# Patient Record
Sex: Male | Born: 1995 | Race: Black or African American | Hispanic: No | Marital: Single | Smoking: Never smoker
Health system: Southern US, Community
[De-identification: ages and names within clinical notes are randomized; demographics above are authoritative.]

## PROBLEM LIST (undated history)

## (undated) DIAGNOSIS — T7840XA Allergy, unspecified, initial encounter: Secondary | ICD-10-CM

## (undated) DIAGNOSIS — G47419 Narcolepsy without cataplexy: Secondary | ICD-10-CM

## (undated) DIAGNOSIS — R400 Somnolence: Secondary | ICD-10-CM

## (undated) DIAGNOSIS — L709 Acne, unspecified: Secondary | ICD-10-CM

## (undated) DIAGNOSIS — F909 Attention-deficit hyperactivity disorder, unspecified type: Secondary | ICD-10-CM

## (undated) DIAGNOSIS — E301 Precocious puberty: Secondary | ICD-10-CM

## (undated) DIAGNOSIS — J302 Other seasonal allergic rhinitis: Secondary | ICD-10-CM

## (undated) HISTORY — DX: Somnolence: R40.0

## (undated) HISTORY — DX: Other seasonal allergic rhinitis: J30.2

## (undated) HISTORY — DX: Narcolepsy without cataplexy: G47.419

## (undated) HISTORY — DX: Allergy, unspecified, initial encounter: T78.40XA

## (undated) HISTORY — DX: Acne, unspecified: L70.9

## (undated) HISTORY — DX: Attention-deficit hyperactivity disorder, unspecified type: F90.9

## (undated) HISTORY — DX: Precocious puberty: E30.1

## (undated) HISTORY — PX: CIRCUMCISION: SUR203

---
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
## 2017-03-04 ENCOUNTER — Ambulatory Visit: Payer: BLUE CROSS/BLUE SHIELD | Admitting: Pediatrics

## 2017-03-24 ENCOUNTER — Ambulatory Visit (INDEPENDENT_AMBULATORY_CARE_PROVIDER_SITE_OTHER): Payer: BLUE CROSS/BLUE SHIELD | Admitting: Pediatrics

## 2017-03-24 VITALS — BP 116/70 | Ht 66.5 in | Wt 211.0 lb

## 2017-03-24 DIAGNOSIS — Z00129 Encounter for routine child health examination without abnormal findings: Secondary | ICD-10-CM

## 2017-03-24 DIAGNOSIS — Z68.41 Body mass index (BMI) pediatric, greater than or equal to 95th percentile for age: Secondary | ICD-10-CM | POA: Diagnosis not present

## 2017-03-24 DIAGNOSIS — Z0000 Encounter for general adult medical examination without abnormal findings: Secondary | ICD-10-CM | POA: Diagnosis not present

## 2017-03-24 NOTE — Patient Instructions (Signed)
Preventive Care for [place], Male The transition to life after high school as a young adult can be a stressful time with many changes. You may start seeing a primary care physician instead of a pediatrician. This is the time when your health care becomes your responsibility. Preventive care refers to lifestyle choices and visits with your health care provider that can promote health and wellness. What does preventive care include?  A yearly physical exam. This is also called an annual wellness visit.  Dental exams once or twice a year.  Routine eye exams. Ask your health care provider how often you should have your eyes checked.  Personal lifestyle choices, including: ? Daily care of your teeth and gums. ? Regular physical activity. ? Eating a healthy diet. ? Avoiding tobacco and drug use. ? Avoiding or limiting alcohol use. ? Practicing safe sex. What happens during an annual wellness visit? Preventive care starts with a yearly visit to your primary care physician. The services and screenings done by your health care provider during your annual wellness visit will depend on your overall health, lifestyle risk factors, and family history of disease. Counseling Your health care provider may ask you questions about:  Past medical problems and your family's medical history.  Medicines or supplements that you take.  Health insurance and access to health care.  Alcohol, tobacco, and drug use, including use of any bodybuilding drugs (anabolic steroids).  Your safety at home, work, or school.  Access to firearms.  Emotional well-being and how you cope with stress.  Relationship well-being.  Diet, exercise, and sleep habits.  Your sexual health and activity.  Screening You may have the following tests or measurements:  Height, weight, and BMI.  Blood pressure.  Lipid and cholesterol levels.  Tuberculosis skin test.  Skin exam.  Vision and hearing tests.  Genital  exam to check for testicular cancer or hernias.  Screening test for hepatitis.  Screening tests for STDs (sexually transmitted diseases), if you are at risk.  Vaccines Your health care provider may recommend certain vaccines, such as:  Influenza vaccine. This is recommended every year.  Tetanus, diphtheria, and acellular pertussis (Tdap, Td) vaccine. You may need a Td booster every 10 years.  Varicella vaccine. You may need this if you have not been vaccinated.  HPV vaccine. If you are 8 or younger, you may need three doses over 6 months.  Measles, mumps, and rubella (MMR) vaccine. You may need at least one dose of MMR. You may also need a second dose.  Pneumococcal 13-valent conjugate (PCV13) vaccine. You may need this if you have certain conditions and have not been vaccinated.  Pneumococcal polysaccharide (PPSV23) vaccine. You may need one or two doses if you smoke cigarettes or if you have certain conditions.  Meningococcal vaccine. One dose is recommended if you are age 83-21 years and a first-year college student living in a residence hall, or if you have one of several medical conditions. You may also need additional booster doses.  Hepatitis A vaccine. You may need this if you have certain conditions or if you travel or work in places where you may be exposed to hepatitis A.  Hepatitis B vaccine. You may need this if you have certain conditions or if you travel or work in places where you may be exposed to hepatitis B.  Haemophilus influenzae type b (Hib) vaccine. You may need this if you have certain risk factors.  Talk to your health care provider about which  screenings and vaccines you need and how often you need them. What steps can I take to develop healthy behaviors?  Have regular preventive health care visits with your primary care physician and dentist.  Eat a healthy diet.  Drink enough fluid to keep your urine clear or pale yellow.  Stay active. Exercise at  least 30 minutes 5 or more days of the week.  Use alcohol responsibly.  Maintain a healthy weight.  Do not use any products that contain nicotine, such as cigarettes, chewing tobacco, and e-cigarettes. If you need help quitting, ask your health care provider.  Do not use drugs.  Practice safe sex. This includes using condoms to prevent STDs or an unwanted pregnancy.  Find healthy ways to manage stress. How can I protect myself from injury? Injuries from violence or accidents are the leading cause of death among young adults and can often be prevented. Take these steps to help protect yourself:  Always wear your seat belt while driving or riding in a vehicle.  Do not drive if you have been drinking alcohol. Do not ride with someone who has been drinking.  Do not drive when you are tired or distracted. Do not text while driving.  Wear a helmet and other protective equipment during sports activities.  If you have firearms in your house, make sure you follow all gun safety procedures.  Seek help if you have been bullied, physically abused, or sexually abused.  Avoid fighting.  Use the Internet responsibly to avoid dangers such as online bullying. What can I do to cope with stress? Young adults may face many new challenges that can be stressful, such as finding a job, going to college, moving away from home, managing money, being in a relationship, getting married, and having children. To manage stress:  Avoid known stressful situations when you can.  Exercise regularly.  Find a stress-reducing activity that works best for you. Examples include meditation, yoga, listening to music, or reading.  Spend time in nature.  Keep a journal to write about your stress and how you respond.  Talk to your health care provider about stress. He or she may suggest counseling.  Spend time with supportive friends or family.  Do not cope with stress by:  Drinking alcohol or using  drugs.  Smoking cigarettes.  Eating. Where can I get more information? Learn more about preventive care and healthy habits from:  U.S. Preventive Services Task Force: www.uspreventiveservicestaskforce.org/Tools/ConsumerInfo/Index/information-for-consumers  National Adolescent and Young Adult Health Information Center: http://nahic.ucsf.edu/resource-center/  American Academy of Pediatrics Bright Futures: https://brightfutures.aap.org  Society for Adolescent Health and Medicine: www.adolescenthealth.org/Resources/Clinical-Care-Resources/Mental-Health/Mental-Health-Resources-For-Adolesc.aspx  HealthCare.gov: www.healthcare.gov/young-adults/coverage/ This information is not intended to replace advice given to you by your health care provider. Make sure you discuss any questions you have with your health care provider. Document Released: 01/08/2016 Document Revised: 01/29/2016 Document Reviewed: 01/08/2016 Elsevier Interactive Patient Education  2017 Elsevier Inc.  

## 2017-03-25 ENCOUNTER — Encounter: Payer: Self-pay | Admitting: Pediatrics

## 2017-03-25 NOTE — Progress Notes (Signed)
Adolescent Well Care Visit Phillip Yates is a 21 y.o. male who is here for well care.    PCP:  Georgiann Hahnamgoolam, Bretton Tandy, MD   History was provided by the patient.  Confidentiality was discussed with the patient and, if applicable, with caregiver as well.   PCP:  Georgiann HahnAMGOOLAM, Jad Johansson, MD   History was provided by the patient and mother.  Current Issues: Current concerns include: none.   Nutrition: Nutrition/Eating Behaviors: good Adequate calcium in diet?: yes Supplements/ Vitamins: yes  Exercise/ Media: Play any Sports?/ Exercise: yes Screen Time:  < 2 hours Media Rules or Monitoring?: yes  Sleep:  Sleep: 8-10 hours  Social Screening: Lives with:  parents Parental relations:  good Activities, Work, and Regulatory affairs officerChores?: yes Concerns regarding behavior with peers?  no Stressors of note: no  Education:  School Grade: first year of [employer] School performance: doing well; no concerns School Behavior: doing well; no concerns  Menstruation:   No LMP for male patient.    Tobacco?  no Secondhand smoke exposure?  no Drugs/ETOH?  no  Sexually Active?  no     Safe at home, in school & in relationships?  Yes Safe to self?  Yes   Screenings: Patient has a dental home: yes  The patient completed the Rapid Assessment for Adolescent Preventive Services screening questionnaire and the following topics were identified as risk factors and discussed: healthy eating, exercise, seatbelt use, bullying, abuse/trauma, weapon use, tobacco use, marijuana use, drug use, condom use, birth control, sexuality, suicidality/self harm, mental health issues, social isolation, school problems, family problems and screen time    PHQ-9 completed and results indicated --no risk  Physical Exam:  Vitals:   03/24/17 1429  BP: 116/70  Weight: 211 lb (95.7 kg)  Height: 5' 6.5" (1.689 m)   BP 116/70   Ht 5' 6.5" (1.689 m)   Wt 211 lb (95.7 kg)   BMI 33.55 kg/m  Body mass index: body mass index is 33.55  kg/m. Blood pressure percentiles are 36 % systolic and 56 % diastolic based on the August 2017 AAP Clinical Practice Guideline. Blood pressure percentile targets: 90: 132/81, 95: 137/84, 95 + 12 mmHg: 149/96.   Hearing Screening   125Hz  250Hz  500Hz  1000Hz  2000Hz  3000Hz  4000Hz  6000Hz  8000Hz   Right ear:   20 20 20 20 20     Left ear:   20 20 20 20 20       Visual Acuity Screening   Right eye Left eye Both eyes  Without correction: 10/10 10/10   With correction:       General Appearance:   alert, oriented, no acute distress and well nourished  HENT: Normocephalic, no obvious abnormality, conjunctiva clear  Mouth:   Normal appearing teeth, no obvious discoloration, dental caries, or dental caps  Neck:   Supple; thyroid: no enlargement, symmetric, no tenderness/mass/nodules  Chest normal  Lungs:   Clear to auscultation bilaterally, normal work of breathing  Heart:   Regular rate and rhythm, S1 and S2 normal, no murmurs;   Abdomen:   Soft, non-tender, no mass, or organomegaly  GU normal male genitals, no testicular masses or hernia  Musculoskeletal:   Tone and strength strong and symmetrical, all extremities               Lymphatic:   No cervical adenopathy  Skin/Hair/Nails:   Skin warm, dry and intact, no rashes, no bruises or petechiae  Neurologic:   Strength, gait, and coordination normal and age-appropriate  Assessment and Plan:   Well adolescent visit  BMI is not appropriate for age--overweight  Hearing screening result:normal Vision screening result: normal    Return in about 1 year (around 03/24/2018).Marland Kitchen  Georgiann Hahn, MD

## 2018-01-16 ENCOUNTER — Ambulatory Visit: Payer: BLUE CROSS/BLUE SHIELD | Admitting: Pediatrics

## 2018-01-16 VITALS — Wt 213.6 lb

## 2018-01-16 DIAGNOSIS — L03011 Cellulitis of right finger: Secondary | ICD-10-CM | POA: Diagnosis not present

## 2018-01-16 MED ORDER — CEPHALEXIN 500 MG PO CAPS: 1000.0000 mg | ORAL_CAPSULE | Freq: Two times a day (BID) | ORAL | 0 refills | Status: AC

## 2018-01-16 MED ORDER — MUPIROCIN 2 % EX OINT: 1.0000 "application " | TOPICAL_OINTMENT | Freq: Two times a day (BID) | CUTANEOUS | 0 refills | Status: DC

## 2018-01-16 NOTE — Patient Instructions (Signed)
Paronychia  Paronychia is an infection of the skin. It happens near a fingernail or toenail. It may cause pain and swelling around the nail. Usually, it is not serious and it clears up with treatment.  Follow these instructions at home:   Soak the fingers or toes in warm water as told by your doctor. You may be told to do this for 20 minutes, 2-3 times a day.   Keep the area dry when you are not soaking it.   Take medicines only as told by your doctor.   If you were given an antibiotic medicine, finish all of it even if you start to feel better.   Keep the affected area clean.   Do not try to drain a fluid-filled bump yourself.   Wear rubber gloves when putting your hands in water.   Wear gloves if your hands might touch cleaners or chemicals.   Follow your doctor's instructions about:  ? Wound care.  ? Bandage (dressing) changes and removal.  Contact a doctor if:   Your symptoms get worse or do not improve.   You have a fever or chills.   You have redness spreading from the affected area.   You have more fluid, blood, or pus coming from the affected area.   Your finger or knuckle is swollen or is hard to move.  This information is not intended to replace advice given to you by your health care provider. Make sure you discuss any questions you have with your health care provider.  Document Released: 08/11/2009 Document Revised: 01/29/2016 Document Reviewed: 07/31/2014  Elsevier Interactive Patient Education  2018 Elsevier Inc.

## 2018-01-16 NOTE — Progress Notes (Signed)
Subjective:    Phillip Yates is a 22 y.o. old male here with his self for check finger   HPI: Phillip Yates presents with history of 2 days ago middle finger started hurting and got red around nail.  He keeps hitting it and it will hurt.  He has been doing warm water soaks.  There is some red around the base of the nail.  Denies any fevers, red streaking.    The following portions of the patient's history were reviewed and updated as appropriate: allergies, current medications, past family history, past medical history, past social history, past surgical history and problem list.  Review of Systems Pertinent items are noted in HPI.   Allergies: No Known Allergies   Current Outpatient Medications on File Prior to Visit  Medication Sig Dispense Refill  . Adapalene-Benzoyl Peroxide 0.1-2.5 % gel Apply 1 application topically at bedtime. 45 g 0  . Armodafinil 150 MG tablet Take 1 tablet (150 mg total) by mouth daily. 30 tablet 0  . Armodafinil 150 MG tablet Take 1 tablet (150 mg total) by mouth daily. 30 tablet 3  . Armodafinil 150 MG tablet Take 1 tablet (150 mg total) by mouth daily. 30 tablet 3  . montelukast (SINGULAIR) 10 MG tablet Take 1 tablet (10 mg total) by mouth at bedtime. 30 tablet 12  . NUVIGIL 150 MG tablet TAKE 1 TABLET BY MOUTH ONCE DAILY 30 tablet 5   No current facility-administered medications on file prior to visit.     History and Problem List: Past Medical History:  Diagnosis Date  . Acne   . ADHD (attention deficit hyperactivity disorder)   . Allergy   . Daytime somnolence   . Isosexual precocious puberty   . Narcolepsy   . 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 Seasonal allergies         Objective:    Wt 213 lb 9.6 oz (96.9 kg)   BMI 33.96 kg/m   General: alert, active, cooperative, non toxic Lungs: clear to auscultation, no wheeze, crackles or retractions Heart: RRR, Nl S1, S2, no murmurs Abd: soft, non tender, non distended, normal BS, no organomegaly, no masses  appreciated Skin: no rashes, right 3rd finger lateral edge of fingernail with pus seen around fingernaile, swelling and mild erythema.  Post exam I&D with pus drained  Neuro: normal mental status, No focal deficits  No results found for this or any previous visit (from the past 72 hour(s)).     Assessment:   Phillip Yates is a 22 y.o. old male with  1. Paronychia of finger of right hand     Plan:   1.  I&D of paronychia performed with no complications.  Small pea sized amount of pus removed.  Antibiotics started below.  Discussed signs to monitor for that would need re evaluation.  Supportive care discussed.  Continue warm soaks, motrin for pain as needed.    Meds ordered this encounter  Medications  . cephALEXin (KEFLEX) 500 MG capsule    Sig: Take 2 capsules (1,000 mg total) by mouth 2 (two) times daily for 7 days.    Dispense:  28 capsule    Refill:  0  . mupirocin ointment (BACTROBAN) 2 %    Sig: Apply 1 application topically 2 (two) times daily.    Dispense:  22 g    Refill:  0     Return if symptoms worsen or fail to improve. in 2-3 days or prior for concerns  Phillip Gip, DO

## 2018-01-23 ENCOUNTER — Encounter: Payer: Self-pay | Admitting: Pediatrics

## 2018-01-23 DIAGNOSIS — L03011 Cellulitis of right finger: Secondary | ICD-10-CM | POA: Insufficient documentation

## 2019-03-21 ENCOUNTER — Other Ambulatory Visit: Payer: Self-pay

## 2019-03-21 DIAGNOSIS — Z20822 Contact with and (suspected) exposure to covid-19: Secondary | ICD-10-CM

## 2019-03-26 LAB — NOVEL CORONAVIRUS, NAA: SARS-CoV-2, NAA: NOT DETECTED

## 2019-06-06 ENCOUNTER — Encounter (HOSPITAL_BASED_OUTPATIENT_CLINIC_OR_DEPARTMENT_OTHER): Payer: Self-pay

## 2019-06-06 ENCOUNTER — Emergency Department (HOSPITAL_BASED_OUTPATIENT_CLINIC_OR_DEPARTMENT_OTHER): Payer: Managed Care, Other (non HMO)

## 2019-06-06 ENCOUNTER — Other Ambulatory Visit: Payer: Self-pay

## 2019-06-06 ENCOUNTER — Emergency Department (HOSPITAL_BASED_OUTPATIENT_CLINIC_OR_DEPARTMENT_OTHER)
Admission: EM | Admit: 2019-06-06 | Discharge: 2019-06-06 | Disposition: A | Discharge status: Home / Self Care | Payer: Managed Care, Other (non HMO) | Attending: Emergency Medicine | Admitting: Emergency Medicine

## 2019-06-06 DIAGNOSIS — Z79899 Other long term (current) drug therapy: Secondary | ICD-10-CM | POA: Diagnosis not present

## 2019-06-06 DIAGNOSIS — W231XXA Caught, crushed, jammed, or pinched between stationary objects, initial encounter: Secondary | ICD-10-CM | POA: Diagnosis not present

## 2019-06-06 DIAGNOSIS — S8782XA Crushing injury of left lower leg, initial encounter: Secondary | ICD-10-CM | POA: Diagnosis present

## 2019-06-06 DIAGNOSIS — Y939 Activity, unspecified: Secondary | ICD-10-CM | POA: Insufficient documentation

## 2019-06-06 DIAGNOSIS — F909 Attention-deficit hyperactivity disorder, unspecified type: Secondary | ICD-10-CM | POA: Insufficient documentation

## 2019-06-06 DIAGNOSIS — Y999 Unspecified external cause status: Secondary | ICD-10-CM | POA: Insufficient documentation

## 2019-06-06 DIAGNOSIS — Y929 Unspecified place or not applicable: Secondary | ICD-10-CM | POA: Diagnosis not present

## 2019-06-06 MED ORDER — HYDROCODONE-ACETAMINOPHEN 5-325 MG PO TABS
2.0000 | ORAL_TABLET | Freq: Once | ORAL | Status: AC
  Administered 2019-06-06: 2 via ORAL
  Filled 2019-06-06: qty 2

## 2019-06-06 MED ORDER — HYDROCODONE-ACETAMINOPHEN 5-325 MG PO TABS: 1.0000 | ORAL_TABLET | ORAL | 0 refills | Status: DC | PRN

## 2019-06-06 NOTE — ED Triage Notes (Signed)
Pt states he was pinned between "pallet jack and pallets" ~120pm-pain from left tib/fib area-lateral redness noted-no break in skin-to triage in w/c

## 2019-06-06 NOTE — ED Provider Notes (Signed)
[facility] EMERGENCY DEPARTMENT Provider Note   CSN: [id number] Arrival date & time: 06/06/19  1439     History   Chief Complaint Chief Complaint  Patient presents with  . Leg Injury    HPI Phillip Yates is a 23 y.o. male.     23 year old male with past medical history below who presents with left leg injury.  Around 1:20 PM today, his leg was pinned between a pallet jack and pallets.  He reports severe pain in his left lower leg. No pain at foot or knee.  No numbness at foot.  No medications prior to arrival and no other injuries.  The history is provided by the patient.    Past Medical History:  Diagnosis Date  . Acne   . ADHD (attention deficit hyperactivity disorder)   . Allergy   . Daytime somnolence   . Isosexual precocious puberty   . Narcolepsy   . 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 Seasonal allergies     Patient Active Problem List   Diagnosis Date Noted  . Paronychia of finger of right hand 01/23/2018  . Epistaxis 11/17/2016  . Well child check 02/26/2016  . BMI (body mass index), pediatric, 95-99% for age 50/22/2017  . 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 Daytime somnolence 07/28/2013    Past Surgical History:  Procedure Laterality Date  . CIRCUMCISION          Home Medications    Prior to Admission medications   Medication Sig Start Date End Date Taking? Authorizing Provider  Adapalene-Benzoyl Peroxide 0.1-2.5 % gel Apply 1 application topically at bedtime. 08/27/13   Faylene Kurtz, MD  Armodafinil 150 MG tablet Take 1 tablet (150 mg total) by mouth daily. 08/12/14   Preston Fleeting, MD  Armodafinil 150 MG tablet Take 1 tablet (150 mg total) by mouth daily. 02/26/16 03/27/16  Georgiann Hahn, MD  Armodafinil 150 MG tablet Take 1 tablet (150 mg total) by mouth daily. 02/26/16 03/27/16  Georgiann Hahn, MD  montelukast (SINGULAIR) 10 MG tablet Take 1 tablet (10 mg total) by mouth at bedtime. 08/27/13   Faylene Kurtz, MD  mupirocin ointment  (BACTROBAN) 2 % Apply 1 application topically 2 (two) times daily. 01/16/18   Myles Gip, DO  NUVIGIL 150 MG tablet TAKE 1 TABLET BY MOUTH ONCE DAILY 03/31/15   Dohmeier, Porfirio Mylar, MD    Family History Family History  Problem Relation Age of Onset  . Cancer Maternal Grandfather        lung cancer from smoking  . Cancer Paternal Grandmother   . Diabetes Paternal Uncle   . Alcohol abuse Neg Hx   . Arthritis Neg Hx   . Asthma Neg Hx   . Birth defects Neg Hx   . Depression Neg Hx   . COPD Neg Hx   . Drug abuse Neg Hx   . Early death Neg Hx   . Hearing loss Neg Hx   . Heart disease Neg Hx   . Hyperlipidemia Neg Hx   . Hypertension Neg Hx   . Kidney disease Neg Hx   . Learning disabilities Neg Hx   . Mental illness Neg Hx   . Mental retardation Neg Hx   . Miscarriages / Stillbirths Neg Hx   . Stroke Neg Hx   . Vision loss Neg Hx   . Varicose Veins Neg Hx     Social History Social History   Tobacco Use  . Smoking status: Never Smoker  . Smokeless tobacco: Never  Used  Substance Use Topics  . Alcohol use: No  . Drug use: No     Allergies   Patient has no known allergies.   Review of Systems Review of Systems  Musculoskeletal: Positive for gait problem. Negative for joint swelling.  Skin: Negative for wound.  Neurological: Negative for numbness.     Physical Exam Updated Vital Signs BP 113/81 (BP Location: Left Arm)   Pulse 80   Temp 98.7 F (37.1 C) (Oral)   Resp 18   Ht 5\' 8"  (1.727 m)   Wt 95.3 kg   SpO2 100%   BMI 31.93 kg/m   Physical Exam Vitals signs and nursing note reviewed.  Constitutional:      General: He is not in acute distress.    Appearance: He is well-developed.  HENT:     Head: Normocephalic and atraumatic.  Eyes:     Conjunctiva/sclera: Conjunctivae normal.  Neck:     Musculoskeletal: Neck supple.  Cardiovascular:     Pulses: Normal pulses.  Musculoskeletal:        General: Swelling and tenderness present.      Comments: Tenderness and swelling of L medial calf and medial distal lower leg, lateral compartments soft, normal sensation foot, no tenderness L knee or ankle  Skin:    General: Skin is warm and dry.  Neurological:     Mental Status: He is alert and oriented to person, place, and time.  Psychiatric:        Judgment: Judgment normal.      ED Treatments / Results  Labs (all labs ordered are listed, but only abnormal results are displayed) Labs Reviewed - No data to display  EKG None  Radiology No results found.  Procedures Procedures (including critical care time)  Medications Ordered in ED Medications - No data to display   Initial Impression / Assessment and Plan / ED Course  I have reviewed the triage vital signs and the nursing notes.  Pertinent imaging results that were available during my care of the patient were reviewed by me and considered in my medical decision making (see chart for details).       Patient did have swelling and tenderness of his medial compartments however no numbness or evidence of circulatory compromise on distal exam.  I do not feel he currently has features of compartment syndrome but I have discussed with Dr. Erlinda Hong, ortho, who can see the patient tomorrow in the clinic for close reassessment.  Have placed an Ace wrap for compression and emphasized the importance of elevation and immobilization to improve swelling as well as ice.  Discussed pain control and provided with crutches to use for comfort.  I have extensively reviewed return precautions with the patient and his dad and instructed to go directly to [facility] should he have significant worsening of symptoms.  They voiced understanding.  Final Clinical Impressions(s) / ED Diagnoses   Final diagnoses:  None    ED Discharge Orders    None       , Wenda Overland, MD 06/06/19 [id number]

## 2019-06-08 ENCOUNTER — Encounter: Payer: Self-pay | Admitting: Orthopaedic Surgery

## 2019-06-08 ENCOUNTER — Ambulatory Visit (INDEPENDENT_AMBULATORY_CARE_PROVIDER_SITE_OTHER): Payer: Managed Care, Other (non HMO) | Admitting: Orthopaedic Surgery

## 2019-06-08 VITALS — Ht 68.0 in | Wt 210.0 lb

## 2019-06-08 DIAGNOSIS — S8782XA Crushing injury of left lower leg, initial encounter: Secondary | ICD-10-CM | POA: Diagnosis not present

## 2019-06-08 MED ORDER — NAPROXEN 500 MG PO TABS: 500.0000 mg | ORAL_TABLET | Freq: Two times a day (BID) | ORAL | 3 refills | Status: DC

## 2019-06-08 NOTE — Progress Notes (Signed)
Office Visit Note   Patient: Phillip Yates           Date of Birth: 1995/10/17           MRN: [id number] Visit Date: 06/08/2019              Requested by: Phillip Hahn, MD [address] [place],  [place] [address] PCP: Phillip Hahn, MD   Assessment & Plan: Visit Diagnoses:  1. Lower leg crush injury, left, initial encounter     Plan: Impression is crush injury to the left leg.  Compartment syndrome has been ruled out.  We will keep him out of work for the next 3 weeks.  I would like to have him start physical therapy for strengthening, gait training, balance, work conditioning.  He is to keep it elevated as much as possible.  Ace wrap for compression.  Naproxen prescribed today.  Recheck in 3 weeks. Total face to face encounter time was greater than 45 minutes and over half of this time was spent in counseling and/or coordination of care.  Follow-Up Instructions: Return in about 3 weeks (around 06/29/2019).   Orders:  Orders Placed This Encounter  Procedures  . Ambulatory referral to Physical Therapy   Meds ordered this encounter  Medications  . naproxen (NAPROSYN) 500 MG tablet    Sig: Take 1 tablet (500 mg total) by mouth 2 (two) times daily with a meal.    Dispense:  30 tablet    Refill:  3      Procedures: No procedures performed   Clinical Data: No additional findings.   Subjective: Chief Complaint  Patient presents with  . Left Leg - Pain    OTJI 06/06/2019    [name] is a 23 year old gentleman comes in for evaluation of the recent crush injury to his left lower leg while at work.  He works at the [employer]  He was evaluated in the in the ER 2 nights ago.  He was told to elevate and ice.  He has been ambulating with a pair of crutches.  He is accompanied by his father today.  He states that he has some occasional numbness and tingling in his left foot.   Review of Systems  Constitutional: Negative.    All other systems reviewed and are negative.    Objective: Vital Signs: Ht 5\' 8"  (1.727 m)   Wt 210 lb (95.3 kg)   BMI 31.93 kg/m   Physical Exam Vitals signs and nursing note reviewed.  Constitutional:      Appearance: He is well-developed.  HENT:     Head: Normocephalic and atraumatic.  Eyes:     Pupils: Pupils are equal, round, and reactive to light.  Neck:     Musculoskeletal: Neck supple.  Pulmonary:     Effort: Pulmonary effort is normal.  Abdominal:     Palpations: Abdomen is soft.  Musculoskeletal: Normal range of motion.  Skin:    General: Skin is warm.  Neurological:     Mental Status: He is alert and oriented to person, place, and time.  Psychiatric:        Behavior: Behavior normal.        Thought Content: Thought content normal.        Judgment: Judgment normal.     Ortho Exam Left lower leg exam shows mild swelling in the calf anterior compartment.  There is no evidence of compartment syndrome.  No neurovascular compromise.  No  pain with passive stretch.  Good range of motion of the ankle and the knee. Specialty Comments:  No specialty comments available.  Imaging: No results found.   PMFS History: Patient Active Problem List   Diagnosis Date Noted  . Paronychia of finger of right hand 01/23/2018  . Epistaxis 11/17/2016  . Well child check 02/26/2016  . BMI (body mass index), pediatric, 95-99% for age 04/27/2016  . 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 Daytime somnolence 07/28/2013   Past Medical History:  Diagnosis Date  . Acne   . ADHD (attention deficit hyperactivity disorder)   . Allergy   . Daytime somnolence   . Isosexual precocious puberty   . Narcolepsy   . 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 Seasonal allergies     Family History  Problem Relation Age of Onset  . Cancer Maternal Grandfather        lung cancer from smoking  . Cancer Paternal Grandmother   . Diabetes Paternal Uncle   . Alcohol abuse Neg Hx   . Arthritis Neg Hx   . Asthma Neg Hx    . Birth defects Neg Hx   . Depression Neg Hx   . COPD Neg Hx   . Drug abuse Neg Hx   . Early death Neg Hx   . Hearing loss Neg Hx   . Heart disease Neg Hx   . Hyperlipidemia Neg Hx   . Hypertension Neg Hx   . Kidney disease Neg Hx   . Learning disabilities Neg Hx   . Mental illness Neg Hx   . Mental retardation Neg Hx   . Miscarriages / Stillbirths Neg Hx   . Stroke Neg Hx   . Vision loss Neg Hx   . Varicose Veins Neg Hx     Past Surgical History:  Procedure Laterality Date  . CIRCUMCISION     Social History   Occupational History  . Occupation: Ship broker  Tobacco Use  . Smoking status: Never Smoker  . Smokeless tobacco: Never Used  Substance and Sexual Activity  . Alcohol use: No  . Drug use: No  . Sexual activity: Not on file

## 2019-06-11 ENCOUNTER — Telehealth: Payer: Self-pay

## 2019-06-11 NOTE — Telephone Encounter (Signed)
Left vm for pt to call me back regarding wc info

## 2019-06-12 ENCOUNTER — Telehealth: Payer: Self-pay | Admitting: Orthopaedic Surgery

## 2019-06-12 NOTE — Telephone Encounter (Signed)
Patient called. He need for someone to call him. About his job wanting him to go back but Dr. Phoebe Sharps note says 2 weeks. Please call him at [phone]

## 2019-06-12 NOTE — Telephone Encounter (Signed)
See message below °

## 2019-06-28 ENCOUNTER — Encounter: Payer: Managed Care, Other (non HMO) | Admitting: Physician Assistant

## 2019-07-30 ENCOUNTER — Other Ambulatory Visit: Payer: Self-pay

## 2021-05-28 ENCOUNTER — Encounter: Payer: Self-pay | Admitting: Emergency Medicine

## 2021-05-28 ENCOUNTER — Ambulatory Visit: Payer: Managed Care, Other (non HMO) | Admitting: Emergency Medicine

## 2021-05-28 ENCOUNTER — Other Ambulatory Visit: Payer: Self-pay

## 2021-05-28 VITALS — BP 116/62 | HR 63 | Temp 97.7°F | Ht 68.0 in | Wt 230.0 lb

## 2021-05-28 DIAGNOSIS — Z13220 Encounter for screening for lipoid disorders: Secondary | ICD-10-CM | POA: Diagnosis not present

## 2021-05-28 DIAGNOSIS — Z130 Encounter for screening for diseases of the blood and blood-forming organs and certain disorders involving the immune mechanism: Secondary | ICD-10-CM

## 2021-05-28 DIAGNOSIS — Z23 Encounter for immunization: Secondary | ICD-10-CM

## 2021-05-28 DIAGNOSIS — Z13228 Encounter for screening for other metabolic disorders: Secondary | ICD-10-CM

## 2021-05-28 DIAGNOSIS — Z1159 Encounter for screening for other viral diseases: Secondary | ICD-10-CM | POA: Diagnosis not present

## 2021-05-28 DIAGNOSIS — Z114 Encounter for screening for human immunodeficiency virus [HIV]: Secondary | ICD-10-CM

## 2021-05-28 DIAGNOSIS — Z6834 Body mass index (BMI) 34.0-34.9, adult: Secondary | ICD-10-CM

## 2021-05-28 DIAGNOSIS — Z0000 Encounter for general adult medical examination without abnormal findings: Secondary | ICD-10-CM | POA: Diagnosis not present

## 2021-05-28 DIAGNOSIS — Z1329 Encounter for screening for other suspected endocrine disorder: Secondary | ICD-10-CM | POA: Diagnosis not present

## 2021-05-28 LAB — COMPREHENSIVE METABOLIC PANEL
ALT: 17 U/L (ref 0–53)
AST: 20 U/L (ref 0–37)
Albumin: 4.3 g/dL (ref 3.5–5.2)
Alkaline Phosphatase: 71 U/L (ref 39–117)
BUN: 16 mg/dL (ref 6–23)
CO2: 30 mEq/L (ref 19–32)
Calcium: 9.6 mg/dL (ref 8.4–10.5)
Chloride: 102 mEq/L (ref 96–112)
Creatinine, Ser: 0.99 mg/dL (ref 0.40–1.50)
GFR: 107.56 mL/min (ref >60.00)
Glucose, Bld: 75 mg/dL (ref 70–99)
Potassium: 4.3 mEq/L (ref 3.5–5.1)
Sodium: 138 mEq/L (ref 135–145)
Total Bilirubin: 1.2 mg/dL (ref 0.2–1.2)
Total Protein: 8 g/dL (ref 6.0–8.3)

## 2021-05-28 LAB — CBC WITH DIFFERENTIAL/PLATELET
Basophils Absolute: 0 10*3/uL (ref 0.0–0.1)
Basophils Relative: 0.2 % (ref 0.0–3.0)
Eosinophils Absolute: 0.1 10*3/uL (ref 0.0–0.7)
Eosinophils Relative: 2.6 % (ref 0.0–5.0)
HCT: 44.1 % (ref 39.0–52.0)
Hemoglobin: 14.9 g/dL (ref 13.0–17.0)
Lymphocytes Relative: 48.8 % — ABNORMAL HIGH (ref 12.0–46.0)
Lymphs Abs: 2.8 10*3/uL (ref 0.7–4.0)
MCHC: 33.8 g/dL (ref 30.0–36.0)
MCV: 85.6 fl (ref 78.0–100.0)
Monocytes Absolute: 0.5 10*3/uL (ref 0.1–1.0)
Monocytes Relative: 8.6 % (ref 3.0–12.0)
Neutro Abs: 2.3 10*3/uL (ref 1.4–7.7)
Neutrophils Relative %: 39.8 % — ABNORMAL LOW (ref 43.0–77.0)
Platelets: 221 10*3/uL (ref 150.0–400.0)
RBC: 5.15 Mil/uL (ref 4.22–5.81)
RDW: 14.1 % (ref 11.5–15.5)
WBC: 5.7 10*3/uL (ref 4.0–10.5)

## 2021-05-28 LAB — LIPID PANEL
Cholesterol: 152 mg/dL (ref 0–200)
HDL: 41.2 mg/dL (ref >39.00)
LDL Cholesterol: 93 mg/dL (ref 0–99)
NonHDL: 111.18
Total CHOL/HDL Ratio: 4
Triglycerides: 91 mg/dL (ref 0.0–149.0)
VLDL: 18.2 mg/dL (ref 0.0–40.0)

## 2021-05-28 LAB — HEMOGLOBIN A1C: Hgb A1c MFr Bld: 5.5 % (ref 4.6–6.5)

## 2021-05-28 LAB — TSH: TSH: 1.35 u[IU]/mL (ref 0.35–5.50)

## 2021-05-28 NOTE — Progress Notes (Signed)
Phillip Yates 25 y.o.   Chief Complaint  Patient presents with   New Patient (Initial Visit)    Physical,pt would like to lose weight    HISTORY OF PRESENT ILLNESS: This is a 25 y.o. male here for his annual exam. First visit to this office wants to establish care with me. Healthy male with a healthy lifestyle however interested in losing some weight. No other complaints or medical concerns today.  HPI   Prior to Admission medications   Medication Sig Start Date End Date Taking? Authorizing Provider  Adapalene-Benzoyl Peroxide 0.1-2.5 % gel Apply 1 application topically at bedtime. Patient not taking: Reported on 05/28/2021 08/27/13   Faylene Kurtz, MD  Armodafinil 150 MG tablet Take 1 tablet (150 mg total) by mouth daily. Patient not taking: Reported on 05/28/2021 08/12/14   Preston Fleeting, MD  Armodafinil 150 MG tablet Take 1 tablet (150 mg total) by mouth daily. 02/26/16 03/27/16  Georgiann Hahn, MD  Armodafinil 150 MG tablet Take 1 tablet (150 mg total) by mouth daily. 02/26/16 03/27/16  Georgiann Hahn, MD  HYDROcodone-acetaminophen (NORCO/VICODIN) 5-325 MG tablet Take 1 tablet by mouth every 4 (four) hours as needed. Patient not taking: Reported on 05/28/2021 06/06/19   Little, Ambrose Finland, MD  montelukast (SINGULAIR) 10 MG tablet Take 1 tablet (10 mg total) by mouth at bedtime. Patient not taking: Reported on 05/28/2021 08/27/13   Faylene Kurtz, MD  mupirocin ointment (BACTROBAN) 2 % Apply 1 application topically 2 (two) times daily. Patient not taking: Reported on 05/28/2021 01/16/18   Myles Gip, DO  naproxen (NAPROSYN) 500 MG tablet Take 1 tablet (500 mg total) by mouth 2 (two) times daily with a meal. 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   Seasonal allergies     Past Surgical History:  Procedure Laterality Date   CIRCUMCISION      Social History   Socioeconomic History   Marital status: Single    Spouse name: Not on file   Number of children: 0   Years of education: 10   Highest education level: Not on file  Occupational History   Occupation: student  Tobacco Use   Smoking status: Never   Smokeless tobacco: Never  Vaping Use   Vaping Use: Never used  Substance and Sexual Activity   Alcohol use: No   Drug use: No   Sexual activity: Not on file  Other Topics Concern   Not on file  Social History Narrative   Lives with parents and younger brother   10th grade [employer]   Patient is left-handed.   Patient drinks tea occasionally.         Social Determinants of Health   Financial Resource Strain: Not on file  Food Insecurity: Not on file  Transportation Needs: Not on file  Physical Activity: Not on file  Stress:  Not on file  Social Connections: Not on file  Intimate Partner Violence: Not on file    Family History  Problem Relation Age of Onset   Cancer Maternal Grandfather        lung cancer from smoking   Cancer Paternal Grandmother    Diabetes Paternal Uncle    Alcohol abuse Neg Hx    Arthritis Neg Hx    Asthma Neg Hx    Birth defects Neg Hx    Depression Neg Hx    COPD Neg Hx    Drug abuse Neg Hx    Early death Neg Hx    Hearing loss Neg Hx    Heart disease Neg Hx    Hyperlipidemia Neg Hx    Hypertension Neg Hx    Kidney disease Neg Hx    Learning disabilities Neg Hx    Mental illness Neg Hx    Mental retardation Neg Hx     Miscarriages / Stillbirths Neg Hx    Stroke Neg Hx    Vision loss Neg Hx    Varicose Veins Neg Hx      Review of Systems  Constitutional: Negative.  Negative for chills and fever.  HENT: Negative.  Negative for congestion and sore throat.   Respiratory: Negative.  Negative for cough and shortness of breath.   Cardiovascular: Negative.  Negative for chest pain and palpitations.  Gastrointestinal: Negative.  Negative for abdominal pain, diarrhea, nausea and vomiting.  Genitourinary: Negative.  Negative for dysuria and hematuria.  Musculoskeletal: Negative.   Skin: Negative.  Negative for rash.  Neurological: Negative.  Negative for dizziness and headaches.  All other systems reviewed and are negative.   Physical Exam Vitals reviewed.  Constitutional:      Appearance: Normal appearance.  HENT:     Head: Normocephalic.     Right Ear: Tympanic membrane, ear canal and external ear normal.     Left Ear: Tympanic membrane, ear canal and external ear normal.     Mouth/Throat:     Mouth: Mucous membranes are moist.     Pharynx: Oropharynx is clear.  Eyes:     Extraocular Movements: Extraocular movements intact.     Conjunctiva/sclera: Conjunctivae normal.     Pupils: Pupils are equal, round, and reactive to light.  Cardiovascular:     Rate and Rhythm: Normal rate and regular rhythm.     Pulses: Normal pulses.     Heart sounds: Normal heart sounds.  Pulmonary:     Effort: Pulmonary effort is normal.     Breath sounds: Normal breath sounds.  Abdominal:     General: Bowel sounds are normal. There is no distension.     Palpations: Abdomen is soft. There is no mass.     Tenderness: There is no abdominal tenderness.  Musculoskeletal:        General: Normal range of motion.     Cervical back: Normal range of motion and neck supple. No tenderness.     Right lower leg: No edema.     Left lower leg: No edema.  Lymphadenopathy:     Cervical: No cervical adenopathy.  Skin:    General:  Skin is warm and dry.     Capillary Refill: Capillary refill takes less than 2 seconds.  Neurological:     General: No focal deficit present.     Mental Status: He is alert and oriented to person, place, and time.  Psychiatric:        Mood and  Affect: Mood normal.        Behavior: Behavior normal.     ASSESSMENT & PLAN: Phillip Yates was seen today for new patient (initial visit).  Diagnoses and all orders for this visit:  Routine general medical examination at a health care facility  Need for hepatitis C screening test -     Hepatitis C antibody screen  Screening for HIV (human immunodeficiency virus) -     HIV antibody  Screening for deficiency anemia -     CBC with Differential  Screening for lipoid disorders -     Lipid panel  Screening for endocrine, metabolic and immunity disorder -     Comprehensive metabolic panel -     Hemoglobin A1c -     TSH  Need for influenza vaccination -     Flu Vaccine QUAD 19mo+IM (Fluarix, Fluzone & Alfiuria Quad PF)  Body mass index (BMI) of 34.0-34.9 in adult  Patient Instructions  Health Maintenance, Male Adopting a healthy lifestyle and getting preventive care are important in promoting health and wellness. Ask your health care provider about: The right schedule for you to have regular tests and exams. Things you can do on your own to prevent diseases and keep yourself healthy. What should I know about diet, weight, and exercise? Eat a healthy diet  Eat a diet that includes plenty of vegetables, fruits, low-fat dairy products, and lean protein. Do not eat a lot of foods that are high in solid fats, added sugars, or sodium. Maintain a healthy weight Body mass index (BMI) is a measurement that can be used to identify possible weight problems. It estimates body fat based on height and weight. Your health care provider can help determine your BMI and help you achieve or maintain a healthy weight. Get regular exercise Get regular  exercise. This is one of the most important things you can do for your health. Most adults should: Exercise for at least 150 minutes each week. The exercise should increase your heart rate and make you sweat (moderate-intensity exercise). Do strengthening exercises at least twice a week. This is in addition to the moderate-intensity exercise. Spend less time sitting. Even light physical activity can be beneficial. Watch cholesterol and blood lipids Have your blood tested for lipids and cholesterol at 23 years of age, then have this test every 5 years. You may need to have your cholesterol levels checked more often if: Your lipid or cholesterol levels are high. You are older than 23 years of age. You are at high risk for heart disease. What should I know about cancer screening? Many types of cancers can be detected early and may often be prevented. Depending on your health history and family history, you may need to have cancer screening at various ages. This may include screening for: Colorectal cancer. Prostate cancer. Skin cancer. Lung cancer. What should I know about heart disease, diabetes, and high blood pressure? Blood pressure and heart disease High blood pressure causes heart disease and increases the risk of stroke. This is more likely to develop in people who have high blood pressure readings, are of African descent, or are overweight. Talk with your health care provider about your target blood pressure readings. Have your blood pressure checked: Every 3-5 years if you are 66-25 years of age. Every year if you are 46 years old or older. If you are between the ages of 2 and 70 and are a current or former smoker, ask your health care provider if you  should have a one-time screening for abdominal aortic aneurysm (AAA). Diabetes Have regular diabetes screenings. This checks your fasting blood sugar level. Have the screening done: Once every three years after age 57 if you are at a  normal weight and have a low risk for diabetes. More often and at a younger age if you are overweight or have a high risk for diabetes. What should I know about preventing infection? Hepatitis B If you have a higher risk for hepatitis B, you should be screened for this virus. Talk with your health care provider to find out if you are at risk for hepatitis B infection. Hepatitis C Blood testing is recommended for: Everyone born from [birth year] through 1963. Anyone with known risk factors for hepatitis C. Sexually transmitted infections (STIs) You should be screened each year for STIs, including gonorrhea and chlamydia, if: You are sexually active and are younger than 23 years of age. You are older than 23 years of age and your health care provider tells you that you are at risk for this type of infection. Your sexual activity has changed since you were last screened, and you are at increased risk for chlamydia or gonorrhea. Ask your health care provider if you are at risk. Ask your health care provider about whether you are at high risk for HIV. Your health care provider may recommend a prescription medicine to help prevent HIV infection. If you choose to take medicine to prevent HIV, you should first get tested for HIV. You should then be tested every 3 months for as long as you are taking the medicine. Follow these instructions at home: Lifestyle Do not use any products that contain nicotine or tobacco, such as cigarettes, e-cigarettes, and chewing tobacco. If you need help quitting, ask your health care provider. Do not use street drugs. Do not share needles. Ask your health care provider for help if you need support or information about quitting drugs. Alcohol use Do not drink alcohol if your health care provider tells you not to drink. If you drink alcohol: Limit how much you have to 0-2 drinks a day. Be aware of how much alcohol is in your drink. In the [place] one drink equals one 12 oz bottle  of beer (355 mL), one 5 oz glass of wine (148 mL), or one 1 oz glass of hard liquor (44 mL). General instructions Schedule regular health, dental, and eye exams. Stay current with your vaccines. Tell your health care provider if: You often feel depressed. You have ever been abused or do not feel safe at home. Summary Adopting a healthy lifestyle and getting preventive care are important in promoting health and wellness. Follow your health care provider's instructions about healthy diet, exercising, and getting tested or screened for diseases. Follow your health care provider's instructions on monitoring your cholesterol and blood pressure. This information is not intended to replace advice given to you by your health care provider. Make sure you discuss any questions you have with your health care provider. Document Revised: 10/31/2020 Document Reviewed: 08/16/2018 Elsevier Patient Education  2022 Elsevier Inc.      Edwina Barth, MD [facility]

## 2021-05-28 NOTE — Patient Instructions (Signed)
Health Maintenance, Male Adopting a healthy lifestyle and getting preventive care are important in promoting health and wellness. Ask your health care provider about: The right schedule for you to have regular tests and exams. Things you can do on your own to prevent diseases and keep yourself healthy. What should I know about diet, weight, and exercise? Eat a healthy diet  Eat a diet that includes plenty of vegetables, fruits, low-fat dairy products, and lean protein. Do not eat a lot of foods that are high in solid fats, added sugars, or sodium. Maintain a healthy weight Body mass index (BMI) is a measurement that can be used to identify possible weight problems. It estimates body fat based on height and weight. Your health care provider can help determine your BMI and help you achieve or maintain a healthy weight. Get regular exercise Get regular exercise. This is one of the most important things you can do for your health. Most adults should: Exercise for at least 150 minutes each week. The exercise should increase your heart rate and make you sweat (moderate-intensity exercise). Do strengthening exercises at least twice a week. This is in addition to the moderate-intensity exercise. Spend less time sitting. Even light physical activity can be beneficial. Watch cholesterol and blood lipids Have your blood tested for lipids and cholesterol at 23 years of age, then have this test every 5 years. You may need to have your cholesterol levels checked more often if: Your lipid or cholesterol levels are high. You are older than 23 years of age. You are at high risk for heart disease. What should I know about cancer screening? Many types of cancers can be detected early and may often be prevented. Depending on your health history and family history, you may need to have cancer screening at various ages. This may include screening for: Colorectal cancer. Prostate cancer. Skin cancer. Lung  cancer. What should I know about heart disease, diabetes, and high blood pressure? Blood pressure and heart disease High blood pressure causes heart disease and increases the risk of stroke. This is more likely to develop in people who have high blood pressure readings, are of African descent, or are overweight. Talk with your health care provider about your target blood pressure readings. Have your blood pressure checked: Every 3-5 years if you are 18-39 years of age. Every year if you are 40 years old or older. If you are between the ages of 65 and 75 and are a current or former smoker, ask your health care provider if you should have a one-time screening for abdominal aortic aneurysm (AAA). Diabetes Have regular diabetes screenings. This checks your fasting blood sugar level. Have the screening done: Once every three years after age 45 if you are at a normal weight and have a low risk for diabetes. More often and at a younger age if you are overweight or have a high risk for diabetes. What should I know about preventing infection? Hepatitis B If you have a higher risk for hepatitis B, you should be screened for this virus. Talk with your health care provider to find out if you are at risk for hepatitis B infection. Hepatitis C Blood testing is recommended for: Everyone born from 1945 through 1965. Anyone with known risk factors for hepatitis C. Sexually transmitted infections (STIs) You should be screened each year for STIs, including gonorrhea and chlamydia, if: You are sexually active and are younger than 24 years of age. You are older than 24 years   of age and your health care provider tells you that you are at risk for this type of infection. Your sexual activity has changed since you were last screened, and you are at increased risk for chlamydia or gonorrhea. Ask your health care provider if you are at risk. Ask your health care provider about whether you are at high risk for HIV.  Your health care provider may recommend a prescription medicine to help prevent HIV infection. If you choose to take medicine to prevent HIV, you should first get tested for HIV. You should then be tested every 3 months for as long as you are taking the medicine. Follow these instructions at home: Lifestyle Do not use any products that contain nicotine or tobacco, such as cigarettes, e-cigarettes, and chewing tobacco. If you need help quitting, ask your health care provider. Do not use street drugs. Do not share needles. Ask your health care provider for help if you need support or information about quitting drugs. Alcohol use Do not drink alcohol if your health care provider tells you not to drink. If you drink alcohol: Limit how much you have to 0-2 drinks a day. Be aware of how much alcohol is in your drink. In the [place] one drink equals one 12 oz bottle of beer (355 mL), one 5 oz glass of wine (148 mL), or one 1 oz glass of hard liquor (44 mL). General instructions Schedule regular health, dental, and eye exams. Stay current with your vaccines. Tell your health care provider if: You often feel depressed. You have ever been abused or do not feel safe at home. Summary Adopting a healthy lifestyle and getting preventive care are important in promoting health and wellness. Follow your health care provider's instructions about healthy diet, exercising, and getting tested or screened for diseases. Follow your health care provider's instructions on monitoring your cholesterol and blood pressure. This information is not intended to replace advice given to you by your health care provider. Make sure you discuss any questions you have with your health care provider. Document Revised: 10/31/2020 Document Reviewed: 08/16/2018 Elsevier Patient Education  2022 Elsevier Inc.  

## 2021-05-29 LAB — HEPATITIS C ANTIBODY
Hepatitis C Ab: NONREACTIVE
SIGNAL TO CUT-OFF: 0.13 (ref <1.00)

## 2021-05-29 LAB — HIV ANTIBODY (ROUTINE TESTING W REFLEX): HIV 1&2 Ab, 4th Generation: NONREACTIVE

## 2021-09-29 IMAGING — CR DG TIBIA/FIBULA 2V*L*
4 series · 4 of 4 positions shown · non-contrast
Comparison: None.

CLINICAL DATA: Left lower leg pain

EXAM:
LEFT TIBIA AND FIBULA - 2 VIEW

[t tib/fib ap left (1 of 2)]
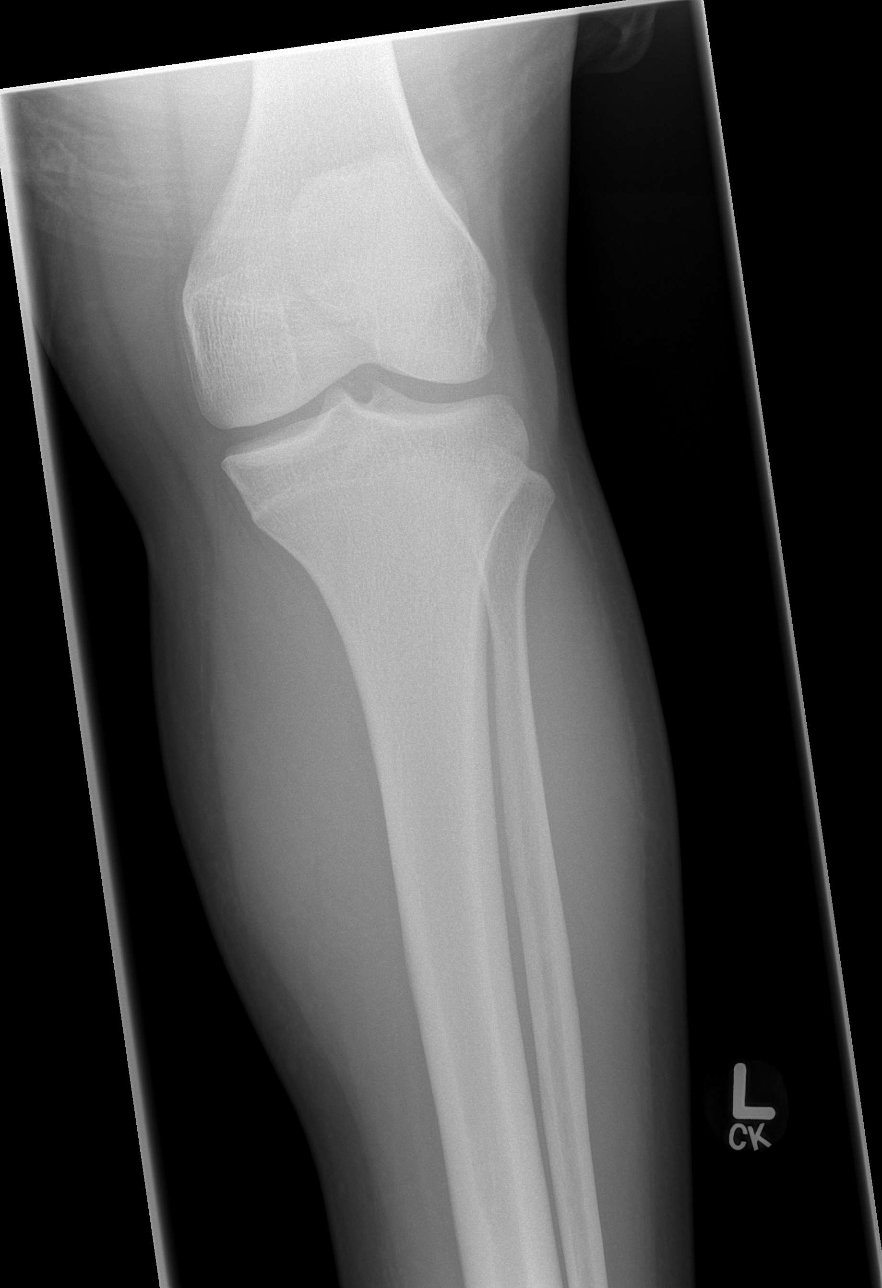

[t tib/fib ap left (2 of 2)]
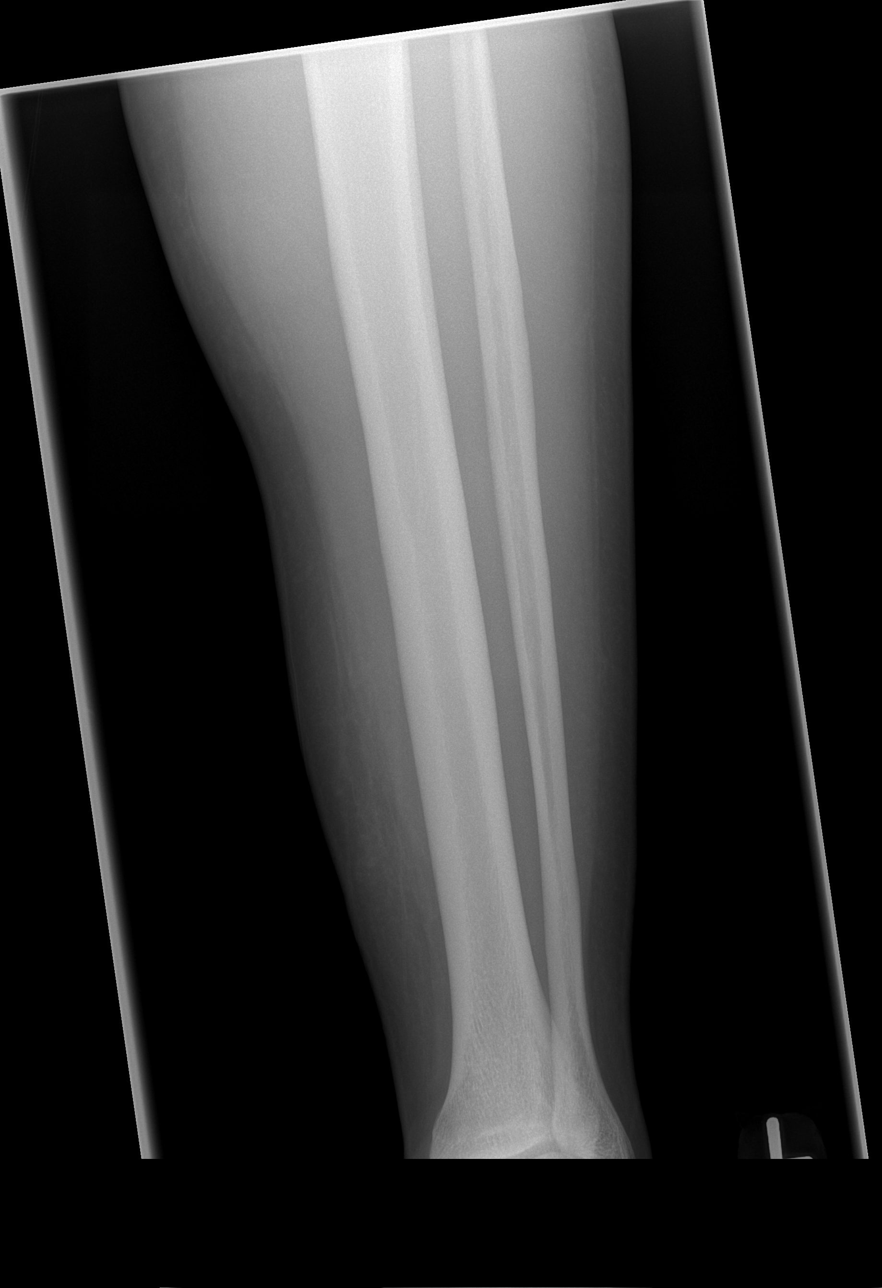

[t tib/fib lat left (1 of 2)]
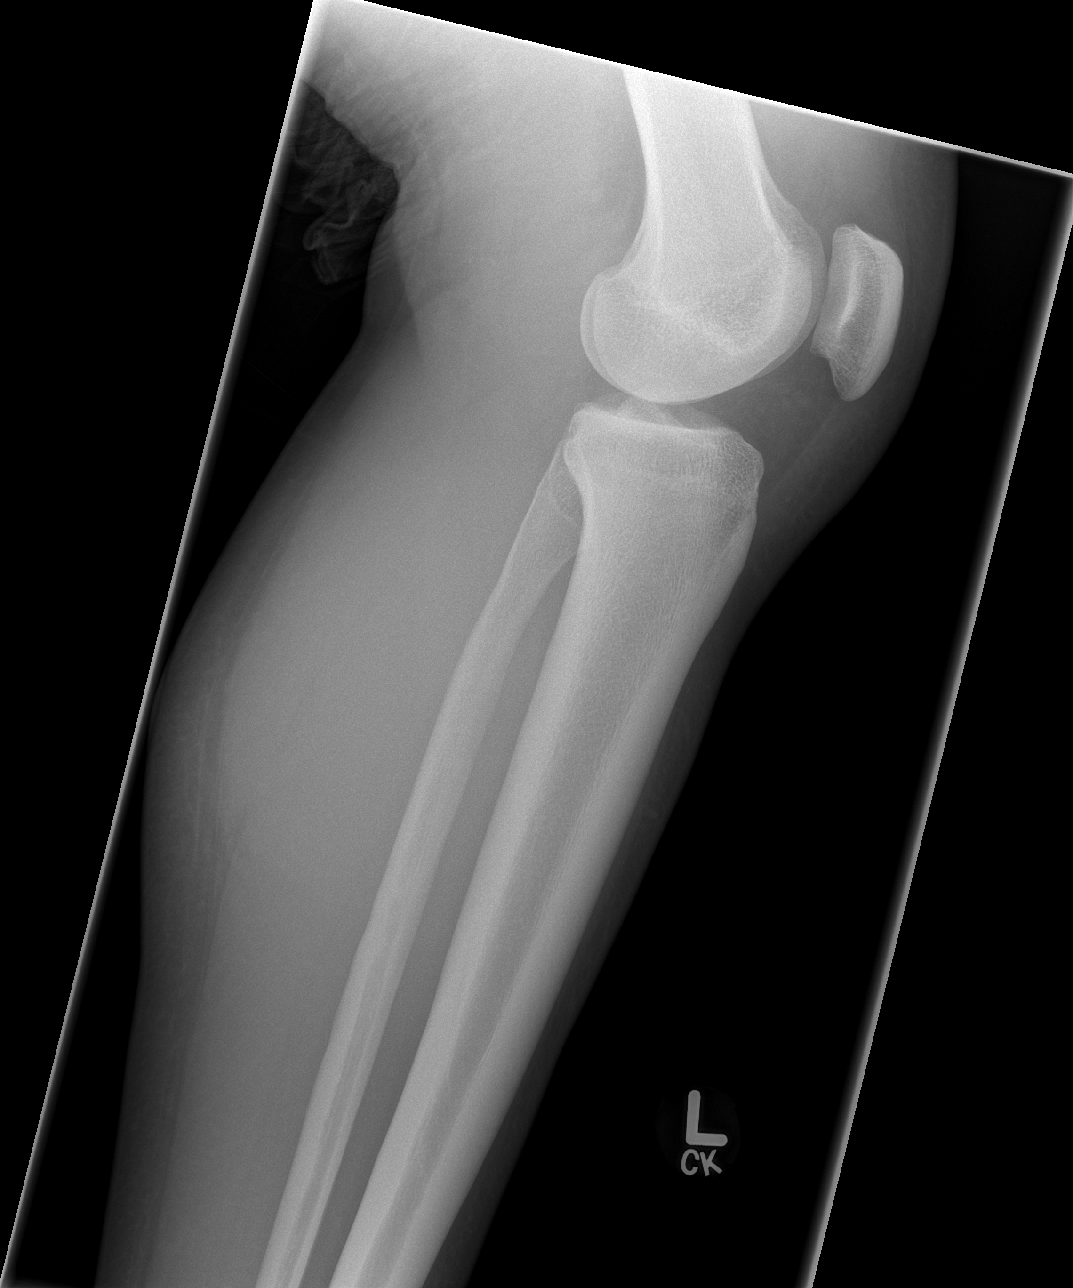

[t tib/fib lat left (2 of 2)]
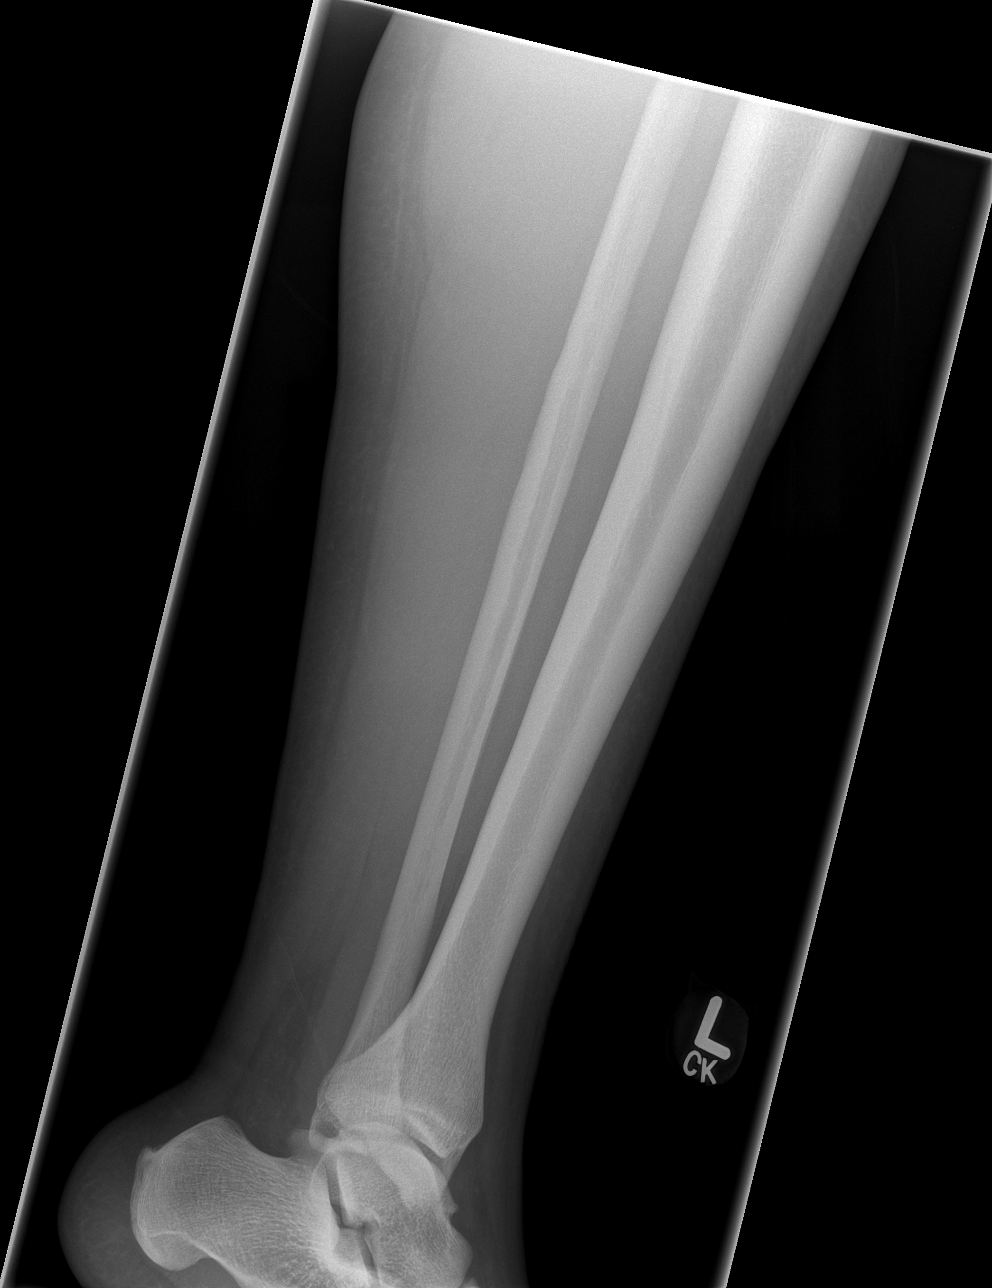

[4 of 4 positions shown; findings below may reference images not displayed]

FINDINGS: There is no evidence of fracture or other focal bone lesions. Soft
tissues are unremarkable.
IMPRESSION: Negative.

## 2022-04-08 ENCOUNTER — Encounter: Payer: Self-pay | Admitting: Emergency Medicine

## 2022-04-08 ENCOUNTER — Ambulatory Visit: Payer: Managed Care, Other (non HMO) | Admitting: Emergency Medicine

## 2022-04-08 VITALS — BP 110/76 | HR 88 | Temp 98.2°F | Ht 68.0 in | Wt 239.1 lb

## 2022-04-08 DIAGNOSIS — G47419 Narcolepsy without cataplexy: Secondary | ICD-10-CM | POA: Diagnosis not present

## 2022-04-08 NOTE — Patient Instructions (Signed)
Health Maintenance, Male Adopting a healthy lifestyle and getting preventive care are important in promoting health and wellness. Ask your health care provider about: The right schedule for you to have regular tests and exams. Things you can do on your own to prevent diseases and keep yourself healthy. What should I know about diet, weight, and exercise? Eat a healthy diet  Eat a diet that includes plenty of vegetables, fruits, low-fat dairy products, and lean protein. Do not eat a lot of foods that are high in solid fats, added sugars, or sodium. Maintain a healthy weight Body mass index (BMI) is a measurement that can be used to identify possible weight problems. It estimates body fat based on height and weight. Your health care provider can help determine your BMI and help you achieve or maintain a healthy weight. Get regular exercise Get regular exercise. This is one of the most important things you can do for your health. Most adults should: Exercise for at least 150 minutes each week. The exercise should increase your heart rate and make you sweat (moderate-intensity exercise). Do strengthening exercises at least twice a week. This is in addition to the moderate-intensity exercise. Spend less time sitting. Even light physical activity can be beneficial. Watch cholesterol and blood lipids Have your blood tested for lipids and cholesterol at 24 years of age, then have this test every 5 years. You may need to have your cholesterol levels checked more often if: Your lipid or cholesterol levels are high. You are older than 24 years of age. You are at high risk for heart disease. What should I know about cancer screening? Many types of cancers can be detected early and may often be prevented. Depending on your health history and family history, you may need to have cancer screening at various ages. This may include screening for: Colorectal cancer. Prostate cancer. Skin cancer. Lung  cancer. What should I know about heart disease, diabetes, and high blood pressure? Blood pressure and heart disease High blood pressure causes heart disease and increases the risk of stroke. This is more likely to develop in people who have high blood pressure readings or are overweight. Talk with your health care provider about your target blood pressure readings. Have your blood pressure checked: Every 3-5 years if you are 18-39 years of age. Every year if you are 40 years old or older. If you are between the ages of 65 and 75 and are a current or former smoker, ask your health care provider if you should have a one-time screening for abdominal aortic aneurysm (AAA). Diabetes Have regular diabetes screenings. This checks your fasting blood sugar level. Have the screening done: Once every three years after age 45 if you are at a normal weight and have a low risk for diabetes. More often and at a younger age if you are overweight or have a high risk for diabetes. What should I know about preventing infection? Hepatitis B If you have a higher risk for hepatitis B, you should be screened for this virus. Talk with your health care provider to find out if you are at risk for hepatitis B infection. Hepatitis C Blood testing is recommended for: Everyone born from 1945 through 1965. Anyone with known risk factors for hepatitis C. Sexually transmitted infections (STIs) You should be screened each year for STIs, including gonorrhea and chlamydia, if: You are sexually active and are younger than 24 years of age. You are older than 24 years of age and your   health care provider tells you that you are at risk for this type of infection. Your sexual activity has changed since you were last screened, and you are at increased risk for chlamydia or gonorrhea. Ask your health care provider if you are at risk. Ask your health care provider about whether you are at high risk for HIV. Your health care provider  may recommend a prescription medicine to help prevent HIV infection. If you choose to take medicine to prevent HIV, you should first get tested for HIV. You should then be tested every 3 months for as long as you are taking the medicine. Follow these instructions at home: Alcohol use Do not drink alcohol if your health care provider tells you not to drink. If you drink alcohol: Limit how much you have to 0-2 drinks a day. Know how much alcohol is in your drink. In the [place] one drink equals one 12 oz bottle of beer (355 mL), one 5 oz glass of wine (148 mL), or one 1 oz glass of hard liquor (44 mL). Lifestyle Do not use any products that contain nicotine or tobacco. These products include cigarettes, chewing tobacco, and vaping devices, such as e-cigarettes. If you need help quitting, ask your health care provider. Do not use street drugs. Do not share needles. Ask your health care provider for help if you need support or information about quitting drugs. General instructions Schedule regular health, dental, and eye exams. Stay current with your vaccines. Tell your health care provider if: You often feel depressed. You have ever been abused or do not feel safe at home. Summary Adopting a healthy lifestyle and getting preventive care are important in promoting health and wellness. Follow your health care provider's instructions about healthy diet, exercising, and getting tested or screened for diseases. Follow your health care provider's instructions on monitoring your cholesterol and blood pressure. This information is not intended to replace advice given to you by your health care provider. Make sure you discuss any questions you have with your health care provider. Document Revised: 01/12/2021 Document Reviewed: 01/12/2021 Elsevier Patient Education  2023 Elsevier Inc.  

## 2022-04-08 NOTE — Progress Notes (Signed)
Phillip Yates 26 y.o.   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 male diagnosed with narcolepsy at age 22. Was then seen by Dr. Vickey Huger and had sleep study done. Needs sleep study for work and [employer] purposes. No other complaints or medical concerns today.  HPI   Prior to Admission medications   Medication Sig Start Date End Date Taking? Authorizing Provider  Adapalene-Benzoyl Peroxide 0.1-2.5 % gel Apply 1 application topically at bedtime. Patient not taking: Reported on 05/28/2021 08/27/13   Faylene Kurtz, MD  Armodafinil 150 MG tablet Take 1 tablet (150 mg total) by mouth daily. Patient not taking: Reported on 05/28/2021 08/12/14   Preston Fleeting, MD  Armodafinil 150 MG tablet Take 1 tablet (150 mg total) by mouth daily. 02/26/16 03/27/16  Georgiann Hahn, MD  Armodafinil 150 MG tablet Take 1 tablet (150 mg total) by mouth daily. 02/26/16 03/27/16  Georgiann Hahn, MD  HYDROcodone-acetaminophen (NORCO/VICODIN) 5-325 MG tablet Take 1 tablet by mouth every 4 (four) hours as needed. Patient not taking: Reported on 05/28/2021 06/06/19   Little, Ambrose Finland, MD  montelukast (SINGULAIR) 10 MG tablet Take 1 tablet (10 mg total) by mouth at bedtime. Patient not taking: Reported on 05/28/2021 08/27/13   Faylene Kurtz, MD  mupirocin ointment (BACTROBAN) 2 % Apply 1 application topically 2 (two) times daily. Patient not taking: Reported on 05/28/2021 01/16/18   Myles Gip, DO  naproxen (NAPROSYN) 500 MG tablet Take 1 tablet (500 mg total) by mouth 2 (two) times daily with a meal. Patient not taking: Reported on 05/28/2021 06/08/19   Tarry Kos, MD  NUVIGIL 150 MG tablet TAKE 1 TABLET BY MOUTH ONCE DAILY Patient not taking: Reported on 05/28/2021 03/31/15   Dohmeier, Porfirio Mylar, MD    No Known Allergies  Patient Active Problem List   Diagnosis Date Noted    BMI (body mass index), pediatric, 95-99% for age 90/22/2017   Narcolepsy 11/02/2013   Daytime somnolence 07/28/2013    Past Medical History:  Diagnosis Date   Acne    ADHD (attention deficit hyperactivity disorder)    Allergy    Daytime somnolence    Isosexual precocious puberty    Narcolepsy    Narcolepsy 11/02/2013   Seasonal allergies     Past Surgical History:  Procedure Laterality Date   CIRCUMCISION      Social History   Socioeconomic History   Marital status: Single    Spouse name: Not on file   Number of children: 0   Years of education: 10   Highest education level: Not on file  Occupational History   Occupation: student  Tobacco Use   Smoking status: Never   Smokeless tobacco: Never  Vaping Use   Vaping Use: Never used  Substance and Sexual Activity   Alcohol use: No   Drug use: No   Sexual activity: Not on file  Other Topics Concern   Not on file  Social History Narrative   Lives with parents and younger brother   10th grade [employer]   Patient is left-handed.   Patient drinks tea occasionally.         Social Determinants of Health   Financial Resource Strain: Not on file  Food Insecurity: Not on file  Transportation Needs: Not on file  Physical Activity: Not on file  Stress:  Not on file  Social Connections: Not on file  Intimate Partner Violence: Not on file    Family History  Problem Relation Age of Onset   Cancer Maternal Grandfather        lung cancer from smoking   Cancer Paternal Grandmother    Diabetes Paternal Uncle    Alcohol abuse Neg Hx    Arthritis Neg Hx    Asthma Neg Hx    Birth defects Neg Hx    Depression Neg Hx    COPD Neg Hx    Drug abuse Neg Hx    Early death Neg Hx    Hearing loss Neg Hx    Heart disease Neg Hx    Hyperlipidemia Neg Hx    Hypertension Neg Hx    Kidney disease Neg Hx    Learning disabilities Neg Hx    Mental illness Neg Hx    Mental retardation Neg Hx    Miscarriages / Stillbirths Neg Hx     Stroke Neg Hx    Vision loss Neg Hx    Varicose Veins Neg Hx      Review of Systems  Constitutional: Negative.  Negative for fever.  HENT: Negative.  Negative for congestion and sore throat.   Respiratory: Negative.  Negative for cough and shortness of breath.   Cardiovascular:  Negative for chest pain and palpitations.  Gastrointestinal:  Negative for abdominal pain, nausea and vomiting.  Genitourinary: Negative.   Skin: Negative.  Negative for rash.  Neurological:  Negative for dizziness and headaches.  All other systems reviewed and are negative.  Today's Vitals   04/08/22 1526  BP: 110/76  Pulse: 88  Temp: 98.2 F (36.8 C)  TempSrc: Oral  SpO2: 95%  Weight: 239 lb 2 oz (108.5 kg)  Height: 5\' 8"  (1.727 m)   Body mass index is 36.36 kg/m.   Physical Exam Vitals reviewed.  Constitutional:      Appearance: Normal appearance.  HENT:     Head: Normocephalic.  Eyes:     Extraocular Movements: Extraocular movements intact.  Cardiovascular:     Rate and Rhythm: Normal rate.  Pulmonary:     Effort: Pulmonary effort is normal.  Skin:    General: Skin is warm and dry.     Capillary Refill: Capillary refill takes less than 2 seconds.  Neurological:     General: No focal deficit present.     Mental Status: He is alert and oriented to person, place, and time.  Psychiatric:        Mood and Affect: Mood normal.        Behavior: Behavior normal.      ASSESSMENT & PLAN: Problem List Items Addressed This Visit       Other   Narcolepsy - Primary    Needs reevaluation. New referral placed for sleep studies.      Relevant Orders   Ambulatory referral to Neurology   Patient Instructions  Health Maintenance, Male Adopting a healthy lifestyle and getting preventive care are important in promoting health and wellness. Ask your health care provider about: The right schedule for you to have regular tests and exams. Things you can do on your own to prevent diseases  and keep yourself healthy. What should I know about diet, weight, and exercise? Eat a healthy diet  Eat a diet that includes plenty of vegetables, fruits, low-fat dairy products, and lean protein. Do not eat a lot of foods that are high in solid fats, added  sugars, or sodium. Maintain a healthy weight Body mass index (BMI) is a measurement that can be used to identify possible weight problems. It estimates body fat based on height and weight. Your health care provider can help determine your BMI and help you achieve or maintain a healthy weight. Get regular exercise Get regular exercise. This is one of the most important things you can do for your health. Most adults should: Exercise for at least 150 minutes each week. The exercise should increase your heart rate and make you sweat (moderate-intensity exercise). Do strengthening exercises at least twice a week. This is in addition to the moderate-intensity exercise. Spend less time sitting. Even light physical activity can be beneficial. Watch cholesterol and blood lipids Have your blood tested for lipids and cholesterol at 24 years of age, then have this test every 5 years. You may need to have your cholesterol levels checked more often if: Your lipid or cholesterol levels are high. You are older than 24 years of age. You are at high risk for heart disease. What should I know about cancer screening? Many types of cancers can be detected early and may often be prevented. Depending on your health history and family history, you may need to have cancer screening at various ages. This may include screening for: Colorectal cancer. Prostate cancer. Skin cancer. Lung cancer. What should I know about heart disease, diabetes, and high blood pressure? Blood pressure and heart disease High blood pressure causes heart disease and increases the risk of stroke. This is more likely to develop in people who have high blood pressure readings or are  overweight. Talk with your health care provider about your target blood pressure readings. Have your blood pressure checked: Every 3-5 years if you are 36-79 years of age. Every year if you are 59 years old or older. If you are between the ages of 11 and 81 and are a current or former smoker, ask your health care provider if you should have a one-time screening for abdominal aortic aneurysm (AAA). Diabetes Have regular diabetes screenings. This checks your fasting blood sugar level. Have the screening done: Once every three years after age 63 if you are at a normal weight and have a low risk for diabetes. More often and at a younger age if you are overweight or have a high risk for diabetes. What should I know about preventing infection? Hepatitis B If you have a higher risk for hepatitis B, you should be screened for this virus. Talk with your health care provider to find out if you are at risk for hepatitis B infection. Hepatitis C Blood testing is recommended for: Everyone born from [birth year] through 1965. Anyone with known risk factors for hepatitis C. Sexually transmitted infections (STIs) You should be screened each year for STIs, including gonorrhea and chlamydia, if: You are sexually active and are younger than 24 years of age. You are older than 24 years of age and your health care provider tells you that you are at risk for this type of infection. Your sexual activity has changed since you were last screened, and you are at increased risk for chlamydia or gonorrhea. Ask your health care provider if you are at risk. Ask your health care provider about whether you are at high risk for HIV. Your health care provider may recommend a prescription medicine to help prevent HIV infection. If you choose to take medicine to prevent HIV, you should first get tested for HIV. You should then  be tested every 3 months for as long as you are taking the medicine. Follow these instructions at  home: Alcohol use Do not drink alcohol if your health care provider tells you not to drink. If you drink alcohol: Limit how much you have to 0-2 drinks a day. Know how much alcohol is in your drink. In the [place] one drink equals one 12 oz bottle of beer (355 mL), one 5 oz glass of wine (148 mL), or one 1 oz glass of hard liquor (44 mL). Lifestyle Do not use any products that contain nicotine or tobacco. These products include cigarettes, chewing tobacco, and vaping devices, such as e-cigarettes. If you need help quitting, ask your health care provider. Do not use street drugs. Do not share needles. Ask your health care provider for help if you need support or information about quitting drugs. General instructions Schedule regular health, dental, and eye exams. Stay current with your vaccines. Tell your health care provider if: You often feel depressed. You have ever been abused or do not feel safe at home. Summary Adopting a healthy lifestyle and getting preventive care are important in promoting health and wellness. Follow your health care provider's instructions about healthy diet, exercising, and getting tested or screened for diseases. Follow your health care provider's instructions on monitoring your cholesterol and blood pressure. This information is not intended to replace advice given to you by your health care provider. Make sure you discuss any questions you have with your health care provider. Document Revised: 01/12/2021 Document Reviewed: 01/12/2021 Elsevier Patient Education  2023 Elsevier Inc.     Edwina Barth, MD [facility]

## 2022-04-08 NOTE — Assessment & Plan Note (Signed)
Needs reevaluation. New referral placed for sleep studies.

## 2022-05-11 ENCOUNTER — Ambulatory Visit: Payer: Managed Care, Other (non HMO) | Admitting: Emergency Medicine

## 2022-05-24 ENCOUNTER — Institutional Professional Consult (permissible substitution): Payer: BLUE CROSS/BLUE SHIELD | Admitting: Neurology

## 2022-05-31 ENCOUNTER — Ambulatory Visit: Payer: Managed Care, Other (non HMO) | Admitting: Emergency Medicine

## 2022-06-03 ENCOUNTER — Ambulatory Visit: Payer: Managed Care, Other (non HMO) | Admitting: Emergency Medicine

## 2022-06-03 ENCOUNTER — Encounter: Payer: Self-pay | Admitting: Emergency Medicine

## 2022-06-03 VITALS — BP 124/78 | HR 70 | Temp 98.2°F | Ht 68.0 in | Wt 236.0 lb

## 2022-06-03 DIAGNOSIS — Z13228 Encounter for screening for other metabolic disorders: Secondary | ICD-10-CM | POA: Diagnosis not present

## 2022-06-03 DIAGNOSIS — Z130 Encounter for screening for diseases of the blood and blood-forming organs and certain disorders involving the immune mechanism: Secondary | ICD-10-CM | POA: Diagnosis not present

## 2022-06-03 DIAGNOSIS — Z1329 Encounter for screening for other suspected endocrine disorder: Secondary | ICD-10-CM

## 2022-06-03 DIAGNOSIS — Z13220 Encounter for screening for lipoid disorders: Secondary | ICD-10-CM

## 2022-06-03 DIAGNOSIS — Z0000 Encounter for general adult medical examination without abnormal findings: Secondary | ICD-10-CM

## 2022-06-03 LAB — COMPREHENSIVE METABOLIC PANEL
ALT: 18 U/L (ref 0–53)
AST: 20 U/L (ref 0–37)
Albumin: 4.2 g/dL (ref 3.5–5.2)
Alkaline Phosphatase: 76 U/L (ref 39–117)
BUN: 13 mg/dL (ref 6–23)
CO2: 30 mEq/L (ref 19–32)
Calcium: 9.2 mg/dL (ref 8.4–10.5)
Chloride: 103 mEq/L (ref 96–112)
Creatinine, Ser: 1.08 mg/dL (ref 0.40–1.50)
GFR: 96.2 mL/min (ref >60.00)
Glucose, Bld: 75 mg/dL (ref 70–99)
Potassium: 4.3 mEq/L (ref 3.5–5.1)
Sodium: 137 mEq/L (ref 135–145)
Total Bilirubin: 1.2 mg/dL (ref 0.2–1.2)
Total Protein: 7.8 g/dL (ref 6.0–8.3)

## 2022-06-03 LAB — CBC WITH DIFFERENTIAL/PLATELET
Basophils Absolute: 0 10*3/uL (ref 0.0–0.1)
Basophils Relative: 0.6 % (ref 0.0–3.0)
Eosinophils Absolute: 0.2 10*3/uL (ref 0.0–0.7)
Eosinophils Relative: 3.3 % (ref 0.0–5.0)
HCT: 43.3 % (ref 39.0–52.0)
Hemoglobin: 14.5 g/dL (ref 13.0–17.0)
Lymphocytes Relative: 35.7 % (ref 12.0–46.0)
Lymphs Abs: 2.4 10*3/uL (ref 0.7–4.0)
MCHC: 33.5 g/dL (ref 30.0–36.0)
MCV: 85.8 fl (ref 78.0–100.0)
Monocytes Absolute: 0.6 10*3/uL (ref 0.1–1.0)
Monocytes Relative: 8.4 % (ref 3.0–12.0)
Neutro Abs: 3.5 10*3/uL (ref 1.4–7.7)
Neutrophils Relative %: 52 % (ref 43.0–77.0)
Platelets: 261 10*3/uL (ref 150.0–400.0)
RBC: 5.04 Mil/uL (ref 4.22–5.81)
RDW: 14.5 % (ref 11.5–15.5)
WBC: 6.7 10*3/uL (ref 4.0–10.5)

## 2022-06-03 LAB — LIPID PANEL
Cholesterol: 162 mg/dL (ref 0–200)
HDL: 32.6 mg/dL — ABNORMAL LOW (ref >39.00)
NonHDL: 129.62
Total CHOL/HDL Ratio: 5
Triglycerides: 212 mg/dL — ABNORMAL HIGH (ref 0.0–149.0)
VLDL: 42.4 mg/dL — ABNORMAL HIGH (ref 0.0–40.0)

## 2022-06-03 LAB — HEMOGLOBIN A1C: Hgb A1c MFr Bld: 5.5 % (ref 4.6–6.5)

## 2022-06-03 LAB — LDL CHOLESTEROL, DIRECT: Direct LDL: 110 mg/dL

## 2022-06-03 NOTE — Patient Instructions (Signed)
Health Maintenance, Male Adopting a healthy lifestyle and getting preventive care are important in promoting health and wellness. Ask your health care provider about: The right schedule for you to have regular tests and exams. Things you can do on your own to prevent diseases and keep yourself healthy. What should I know about diet, weight, and exercise? Eat a healthy diet  Eat a diet that includes plenty of vegetables, fruits, low-fat dairy products, and lean protein. Do not eat a lot of foods that are high in solid fats, added sugars, or sodium. Maintain a healthy weight Body mass index (BMI) is a measurement that can be used to identify possible weight problems. It estimates body fat based on height and weight. Your health care provider can help determine your BMI and help you achieve or maintain a healthy weight. Get regular exercise Get regular exercise. This is one of the most important things you can do for your health. Most adults should: Exercise for at least 150 minutes each week. The exercise should increase your heart rate and make you sweat (moderate-intensity exercise). Do strengthening exercises at least twice a week. This is in addition to the moderate-intensity exercise. Spend less time sitting. Even light physical activity can be beneficial. Watch cholesterol and blood lipids Have your blood tested for lipids and cholesterol at 24 years of age, then have this test every 5 years. You may need to have your cholesterol levels checked more often if: Your lipid or cholesterol levels are high. You are older than 24 years of age. You are at high risk for heart disease. What should I know about cancer screening? Many types of cancers can be detected early and may often be prevented. Depending on your health history and family history, you may need to have cancer screening at various ages. This may include screening for: Colorectal cancer. Prostate cancer. Skin cancer. Lung  cancer. What should I know about heart disease, diabetes, and high blood pressure? Blood pressure and heart disease High blood pressure causes heart disease and increases the risk of stroke. This is more likely to develop in people who have high blood pressure readings or are overweight. Talk with your health care provider about your target blood pressure readings. Have your blood pressure checked: Every 3-5 years if you are 18-39 years of age. Every year if you are 40 years old or older. If you are between the ages of 65 and 75 and are a current or former smoker, ask your health care provider if you should have a one-time screening for abdominal aortic aneurysm (AAA). Diabetes Have regular diabetes screenings. This checks your fasting blood sugar level. Have the screening done: Once every three years after age 45 if you are at a normal weight and have a low risk for diabetes. More often and at a younger age if you are overweight or have a high risk for diabetes. What should I know about preventing infection? Hepatitis B If you have a higher risk for hepatitis B, you should be screened for this virus. Talk with your health care provider to find out if you are at risk for hepatitis B infection. Hepatitis C Blood testing is recommended for: Everyone born from 1945 through 1965. Anyone with known risk factors for hepatitis C. Sexually transmitted infections (STIs) You should be screened each year for STIs, including gonorrhea and chlamydia, if: You are sexually active and are younger than 24 years of age. You are older than 24 years of age and your   health care provider tells you that you are at risk for this type of infection. Your sexual activity has changed since you were last screened, and you are at increased risk for chlamydia or gonorrhea. Ask your health care provider if you are at risk. Ask your health care provider about whether you are at high risk for HIV. Your health care provider  may recommend a prescription medicine to help prevent HIV infection. If you choose to take medicine to prevent HIV, you should first get tested for HIV. You should then be tested every 3 months for as long as you are taking the medicine. Follow these instructions at home: Alcohol use Do not drink alcohol if your health care provider tells you not to drink. If you drink alcohol: Limit how much you have to 0-2 drinks a day. Know how much alcohol is in your drink. In the [place] one drink equals one 12 oz bottle of beer (355 mL), one 5 oz glass of wine (148 mL), or one 1 oz glass of hard liquor (44 mL). Lifestyle Do not use any products that contain nicotine or tobacco. These products include cigarettes, chewing tobacco, and vaping devices, such as e-cigarettes. If you need help quitting, ask your health care provider. Do not use street drugs. Do not share needles. Ask your health care provider for help if you need support or information about quitting drugs. General instructions Schedule regular health, dental, and eye exams. Stay current with your vaccines. Tell your health care provider if: You often feel depressed. You have ever been abused or do not feel safe at home. Summary Adopting a healthy lifestyle and getting preventive care are important in promoting health and wellness. Follow your health care provider's instructions about healthy diet, exercising, and getting tested or screened for diseases. Follow your health care provider's instructions on monitoring your cholesterol and blood pressure. This information is not intended to replace advice given to you by your health care provider. Make sure you discuss any questions you have with your health care provider. Document Revised: 01/12/2021 Document Reviewed: 01/12/2021 Elsevier Patient Education  2023 Elsevier Inc.  

## 2022-06-03 NOTE — Progress Notes (Signed)
Phillip Yates 26 y.o.   No chief complaint on file.   HISTORY OF PRESENT ILLNESS: This is a 26 y.o. male here for annual exam. Has no complaints or medical concerns today.  HPI   Prior to Admission medications   Medication Sig Start Date End Date Taking? Authorizing Provider  Adapalene-Benzoyl Peroxide 0.1-2.5 % gel Apply 1 application topically at bedtime. Patient not taking: Reported on 05/28/2021 08/27/13   Faylene Kurtz, MD  Armodafinil 150 MG tablet Take 1 tablet (150 mg total) by mouth daily. Patient not taking: Reported on 05/28/2021 08/12/14   Preston Fleeting, MD  Armodafinil 150 MG tablet Take 1 tablet (150 mg total) by mouth daily. 02/26/16 03/27/16  Georgiann Hahn, MD  Armodafinil 150 MG tablet Take 1 tablet (150 mg total) by mouth daily. 02/26/16 03/27/16  Georgiann Hahn, MD  HYDROcodone-acetaminophen (NORCO/VICODIN) 5-325 MG tablet Take 1 tablet by mouth every 4 (four) hours as needed. Patient not taking: Reported on 05/28/2021 06/06/19   Little, Ambrose Finland, MD  montelukast (SINGULAIR) 10 MG tablet Take 1 tablet (10 mg total) by mouth at bedtime. Patient not taking: Reported on 05/28/2021 08/27/13   Faylene Kurtz, MD  mupirocin ointment (BACTROBAN) 2 % Apply 1 application topically 2 (two) times daily. Patient not taking: Reported on 05/28/2021 01/16/18   Myles Gip, DO  naproxen (NAPROSYN) 500 MG tablet Take 1 tablet (500 mg total) by mouth 2 (two) times daily with a meal. 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   Seasonal allergies     Past Surgical History:  Procedure Laterality Date   CIRCUMCISION      Social History   Socioeconomic History   Marital status: Single    Spouse name: Not on file   Number of children: 0   Years of education: 10   Highest education level: Not on file  Occupational History   Occupation: student  Tobacco Use   Smoking status: Never   Smokeless tobacco: Never  Vaping Use   Vaping Use: Never used  Substance and Sexual Activity   Alcohol use: No   Drug use: No   Sexual activity: Not on file  Other Topics Concern   Not on file  Social History Narrative   Lives with parents and younger brother   10th grade [employer]   Patient is left-handed.   Patient drinks tea occasionally.         Social Determinants of Health   Financial Resource Strain: Not on file  Food Insecurity: Not on file  Transportation Needs: Not on file  Physical Activity: Not on file  Stress: Not on file  Social Connections: Not on file  Intimate Partner Violence: Not on file    Family History  Problem Relation Age of Onset   Cancer Maternal Grandfather        lung cancer  from smoking   Cancer Paternal Grandmother    Diabetes Paternal Uncle    Alcohol abuse Neg Hx    Arthritis Neg Hx    Asthma Neg Hx    Birth defects Neg Hx    Depression Neg Hx    COPD Neg Hx    Drug abuse Neg Hx    Early death Neg Hx    Hearing loss Neg Hx    Heart disease Neg Hx    Hyperlipidemia Neg Hx    Hypertension Neg Hx    Kidney disease Neg Hx    Learning disabilities Neg Hx    Mental illness Neg Hx    Mental retardation Neg Hx    Miscarriages / Stillbirths Neg Hx    Stroke Neg Hx    Vision loss Neg Hx    Varicose Veins Neg Hx      Review of Systems  Constitutional: Negative.  Negative for chills and fever.  HENT: Negative.  Negative for congestion and sore throat.    Respiratory: Negative.  Negative for cough and shortness of breath.   Cardiovascular: Negative.  Negative for chest pain and palpitations.  Gastrointestinal:  Negative for abdominal pain, diarrhea, nausea and vomiting.  Genitourinary: Negative.  Negative for dysuria.  Musculoskeletal: Negative.   Skin: Negative.  Negative for rash.  Neurological: Negative.  Negative for dizziness and headaches.  All other systems reviewed and are negative.  Today's Vitals   06/03/22 1307  BP: 124/78  Pulse: 70  Temp: 98.2 F (36.8 C)  TempSrc: Oral  SpO2: 97%  Weight: 236 lb (107 kg)  Height: 5\' 8"  (1.727 m)   Body mass index is 35.88 kg/m.   Physical Exam Vitals reviewed.  Constitutional:      Appearance: Normal appearance.  HENT:     Head: Normocephalic.     Right Ear: Tympanic membrane, ear canal and external ear normal.     Left Ear: Tympanic membrane, ear canal and external ear normal.     Mouth/Throat:     Mouth: Mucous membranes are moist.     Pharynx: Oropharynx is clear.  Eyes:     Extraocular Movements: Extraocular movements intact.     Conjunctiva/sclera: Conjunctivae normal.     Pupils: Pupils are equal, round, and reactive to light.  Cardiovascular:     Rate and Rhythm: Normal rate and regular rhythm.     Pulses: Normal pulses.     Heart sounds: Normal heart sounds.  Pulmonary:     Effort: Pulmonary effort is normal.     Breath sounds: Normal breath sounds.  Abdominal:     Palpations: Abdomen is soft.     Tenderness: There is no abdominal tenderness.  Musculoskeletal:     Cervical back: No tenderness.     Right lower leg: No edema.     Left lower leg: No edema.  Lymphadenopathy:     Cervical: No cervical adenopathy.  Skin:    General: Skin is warm and dry.     Capillary Refill: Capillary refill takes less than 2 seconds.  Neurological:     General: No focal deficit present.     Mental Status: He is alert and oriented to person, place, and time.  Psychiatric:         Mood and Affect: Mood normal.        Behavior: Behavior normal.      ASSESSMENT & PLAN: Problem List Items Addressed This Visit   None Visit Diagnoses  Routine general medical examination at a health care facility    -  Primary   Screening for deficiency anemia       Relevant Orders   CBC with Differential   Screening for lipoid disorders       Relevant Orders   Lipid panel   Screening for endocrine, metabolic and immunity disorder       Relevant Orders   Comprehensive metabolic panel   Hemoglobin A1c      Modifiable risk factors discussed with patient. Anticipatory guidance according to age provided. The following topics were also discussed: Social Determinants of Health Smoking.  Non-smoker Diet and nutrition Benefits of exercise Cancer family history review Vaccinations reviewed and recommendations Cardiovascular risk assessment Mental health including depression and anxiety Fall and accident prevention  Patient Instructions  Health Maintenance, Male Adopting a healthy lifestyle and getting preventive care are important in promoting health and wellness. Ask your health care provider about: The right schedule for you to have regular tests and exams. Things you can do on your own to prevent diseases and keep yourself healthy. What should I know about diet, weight, and exercise? Eat a healthy diet  Eat a diet that includes plenty of vegetables, fruits, low-fat dairy products, and lean protein. Do not eat a lot of foods that are high in solid fats, added sugars, or sodium. Maintain a healthy weight Body mass index (BMI) is a measurement that can be used to identify possible weight problems. It estimates body fat based on height and weight. Your health care provider can help determine your BMI and help you achieve or maintain a healthy weight. Get regular exercise Get regular exercise. This is one of the most important things you can do for your health.  Most adults should: Exercise for at least 150 minutes each week. The exercise should increase your heart rate and make you sweat (moderate-intensity exercise). Do strengthening exercises at least twice a week. This is in addition to the moderate-intensity exercise. Spend less time sitting. Even light physical activity can be beneficial. Watch cholesterol and blood lipids Have your blood tested for lipids and cholesterol at 24 years of age, then have this test every 5 years. You may need to have your cholesterol levels checked more often if: Your lipid or cholesterol levels are high. You are older than 24 years of age. You are at high risk for heart disease. What should I know about cancer screening? Many types of cancers can be detected early and may often be prevented. Depending on your health history and family history, you may need to have cancer screening at various ages. This may include screening for: Colorectal cancer. Prostate cancer. Skin cancer. Lung cancer. What should I know about heart disease, diabetes, and high blood pressure? Blood pressure and heart disease High blood pressure causes heart disease and increases the risk of stroke. This is more likely to develop in people who have high blood pressure readings or are overweight. Talk with your health care provider about your target blood pressure readings. Have your blood pressure checked: Every 3-5 years if you are 93-61 years of age. Every year if you are 75 years old or older. If you are between the ages of 40 and 31 and are a current or former smoker, ask your health care provider if you should have a one-time screening for abdominal aortic aneurysm (AAA). Diabetes Have regular diabetes screenings. This checks your fasting blood sugar level. Have the screening done: Once every three  years after age 79 if you are at a normal weight and have a low risk for diabetes. More often and at a younger age if you are overweight or  have a high risk for diabetes. What should I know about preventing infection? Hepatitis B If you have a higher risk for hepatitis B, you should be screened for this virus. Talk with your health care provider to find out if you are at risk for hepatitis B infection. Hepatitis C Blood testing is recommended for: Everyone born from [birth year] through 1963. Anyone with known risk factors for hepatitis C. Sexually transmitted infections (STIs) You should be screened each year for STIs, including gonorrhea and chlamydia, if: You are sexually active and are younger than 24 years of age. You are older than 24 years of age and your health care provider tells you that you are at risk for this type of infection. Your sexual activity has changed since you were last screened, and you are at increased risk for chlamydia or gonorrhea. Ask your health care provider if you are at risk. Ask your health care provider about whether you are at high risk for HIV. Your health care provider may recommend a prescription medicine to help prevent HIV infection. If you choose to take medicine to prevent HIV, you should first get tested for HIV. You should then be tested every 3 months for as long as you are taking the medicine. Follow these instructions at home: Alcohol use Do not drink alcohol if your health care provider tells you not to drink. If you drink alcohol: Limit how much you have to 0-2 drinks a day. Know how much alcohol is in your drink. In the [place] one drink equals one 12 oz bottle of beer (355 mL), one 5 oz glass of wine (148 mL), or one 1 oz glass of hard liquor (44 mL). Lifestyle Do not use any products that contain nicotine or tobacco. These products include cigarettes, chewing tobacco, and vaping devices, such as e-cigarettes. If you need help quitting, ask your health care provider. Do not use street drugs. Do not share needles. Ask your health care provider for help if you need support or information  about quitting drugs. General instructions Schedule regular health, dental, and eye exams. Stay current with your vaccines. Tell your health care provider if: You often feel depressed. You have ever been abused or do not feel safe at home. Summary Adopting a healthy lifestyle and getting preventive care are important in promoting health and wellness. Follow your health care provider's instructions about healthy diet, exercising, and getting tested or screened for diseases. Follow your health care provider's instructions on monitoring your cholesterol and blood pressure. This information is not intended to replace advice given to you by your health care provider. Make sure you discuss any questions you have with your health care provider. Document Revised: 01/12/2021 Document Reviewed: 01/12/2021 Elsevier Patient Education  2023 Elsevier Inc.     Edwina Barth, MD [facility]

## 2022-06-28 ENCOUNTER — Encounter: Payer: Self-pay | Admitting: Neurology

## 2022-06-28 ENCOUNTER — Ambulatory Visit (INDEPENDENT_AMBULATORY_CARE_PROVIDER_SITE_OTHER): Payer: Managed Care, Other (non HMO) | Admitting: Neurology

## 2022-06-28 VITALS — BP 104/63 | HR 98 | Ht 68.0 in | Wt 238.5 lb

## 2022-06-28 DIAGNOSIS — G47411 Narcolepsy with cataplexy: Secondary | ICD-10-CM

## 2022-06-28 DIAGNOSIS — G4719 Other hypersomnia: Secondary | ICD-10-CM

## 2022-06-28 NOTE — Progress Notes (Signed)
**Note Phillip-Identified via Obfuscation** SLEEP MEDICINE CLINIC    Provider:  Larey Seat, MD  Primary Care Physician:  Horald Pollen, [place] [address]     Referring Provider: Agustina Caroli Melba, [place],  [place] [address]          Chief Complaint according to patient   Patient presents with:     New Patient (Initial Visit)           HISTORY OF PRESENT ILLNESS:  Phillip Yates is a 26 y.o. year old 26 or [place] American male patient seen here as a referral on 06/28/2022 from Dr. Mitchel Honour for a new evaluation of excessive daytime sleepiness..  Chief concern according to patient :     I have the pleasure of seeing Phillip Yates today, a right-handed [place] American male with a sleep disorder.  We met last time in Winter early 2015 , at the time he was 26 years old,  had endorsed the modified Epworth sleepiness scale at 20 /21 points in the fatigue severity scale at only 5 points.  He was an otherwise healthy young man and reports that his sleepiness had cost concerns at school he seemed to drop off attention and finally fall asleep during classes mostly after lunch.  The most severe sleepiness always occurred after a meal.  The excessive daytime sleepiness started about 2 years prior when he was between 78 and 86 years old.  At the time he was 5 foot 7 with a weight of 190 pounds.   His BMI was 30 his neck circumference was 16 inches.  Mr. Covin now reports excessive daytime sleepiness at a much more moderate degree 12 out of 24 points and now induced.  He is reporting a fatigue severity score of 23 out of 63 points which is not elevated.  He is not feeling depressed.  He still the still deals with excessive daytime sleepiness however and he was asleep when I entered the exam room.  So I think there is a likely higher degree of Epworth score endorsement realistically.  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  The patient had the first sleep study in the year 2015 : He underwent an overnight polysomnography that was unremarkable followed by an MSLT.  His MSLT showed 4 naps with REM onset.  REM latency in these 4 naps was average 2 minutes.  His sleep latency in general was 5.3 minutes and pathologically short.    He  has a past medical history of Acne, ADHD (attention deficit hyperactivity disorder), Allergy, Daytime somnolence, Isosexual precocious puberty, Narcolepsy, Narcolepsy (11/02/2013), and Seasonal allergies..       Sleep relevant medical history: Nocturia 1 time, no snoring, no Tonsillectomy.   Family medical /sleep history: No other family member on CPAP with OSA,.    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  He lives in a household with parents, saving money-  The patient currently works in shifts( Presenter, broadcasting,) and has 2 jobs.  Tobacco use: none   ETOH use ; none ,  Caffeine intake in form of Coffee( /) Soda( /) Tea ( 2 times a week) or energy drinks. Regular exercise in form of at the job, lifting and walking.  Sleep habits are as follows: The patient's dinner time is between 3-4 PM.  The patient goes to bed at 11-12 PM and continues to sleep for 3-4 hours, wakes for one bathroom break, the first time at  AM.   The preferred sleep position is supine and laterally, with the support of no  pillows.  Dreams are reportedly very frequent/vivid.  5  AM is the usual rise time. The patient wakes up with an alarm.  He reports not feeling refreshed or restored in AM, with.  Naps are taken frequently, lasting from physically active or stimulated-  to 15-30 minutes and are more refreshing for the rest of the day.    Review of Systems: Out of a complete 14 system review, the patient complains of only the following symptoms, and all other reviewed systems are negative.:   Fatigue, sleepiness    How likely are you to doze in the following situations: 0 = not likely, 1 = slight chance, 2 = moderate chance, 3 = high chance   Sitting and Reading? Watching Television? Sitting inactive in a public place (theater or meeting)? As a passenger in a car for an hour without a break? Lying down in the afternoon when circumstances permit? Sitting and talking to someone? Sitting quietly after lunch without alcohol? In a car, while stopped for a few minutes in traffic?   Total ESS = corrected to 16 / 24 points   FSS endorsed at 23/ 63 points.  Denies depression.  Narcolepsy with : Automatism, hypnopompic hallucinations, cataplexy. Sleep paralysis.   Social History   Socioeconomic History   Marital status: Single    Spouse name: Not on file   Number of children: 0   Years of education: 10   Highest education level: Not on file  Occupational History   Occupation: student  Tobacco Use   Smoking status: Never   Smokeless tobacco: Never  Vaping Use   Vaping Use: Never used  Substance and Sexual Activity   Alcohol use: No   Drug use: No   Sexual activity: Not on file  Other Topics Concern   Not on file  Social History Narrative   Lives with parents and younger brother   10th grade [employer]   Patient is left-handed.   Patient drinks tea occasionally.         Social Determinants of Health   Financial Resource Strain: Not on file  Food Insecurity: Not on file  Transportation Needs: Not on file  Physical Activity: Not on file  Stress: Not on file  Social Connections: Not on file    Family History  Problem Relation Age of Onset   Cancer Maternal Grandfather        lung cancer from smoking   Cancer Paternal Grandmother    Diabetes Paternal Uncle    Alcohol abuse Neg Hx    Arthritis Neg Hx    Asthma Neg Hx    Birth defects Neg Hx    Depression Neg Hx    COPD Neg Hx    Drug abuse Neg Hx    Early death Neg Hx    Hearing loss Neg Hx     Heart disease Neg Hx    Hyperlipidemia Neg Hx    Hypertension Neg Hx    Kidney disease Neg Hx    Learning disabilities Neg Hx    Mental illness Neg Hx    Mental retardation Neg Hx    Miscarriages / Stillbirths Neg Hx  Stroke Neg Hx    Vision loss Neg Hx    Varicose Veins Neg Hx     Past Medical History:  Diagnosis Date   Acne    ADHD (attention deficit hyperactivity disorder)    Allergy    Daytime somnolence    Isosexual precocious puberty    Narcolepsy    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   No current facility-administered medications on file prior to visit.    No Known Allergies  Physical exam:  Today's Vitals   06/28/22 1243  BP: 104/63  Pulse: 98  Weight: 238 lb 8 oz (108.2 kg)  Height: $Remove'5\' 8"'PyiLcbR$  (1.727 m)   Body mass index is 36.26 kg/m.   Wt Readings from Last 3 Encounters:  06/28/22 238 lb 8 oz (108.2 kg)  06/03/22 236 lb (107 kg)  04/08/22 239 lb 2 oz (108.5 kg)     Ht Readings from Last 3 Encounters:  06/28/22 $RemoveB'5\' 8"'fxCXzJUh$  (1.727 m)  06/03/22 $RemoveB'5\' 8"'CnWnkhrL$  (1.727 m)  04/08/22 $RemoveB'5\' 8"'HgbvHmqn$  (1.727 m)      General: The patient is awake, alert and appears not in acute distress. The patient is well groomed. Head: Normocephalic, atraumatic. Neck is supple. Mallampati  2 , lateral pillars and chubby midline uvula. ,  neck circumference:17. 25  inches . Nasal airflow patent.  Retrognathia is  seen. Facial hair is present.   Dental status:  Cardiovascular:  Regular rate and cardiac rhythm by pulse,  without distended neck veins. Respiratory: Lungs are clear to auscultation.  Skin:  Without evidence of ankle edema, or rash. Trunk: The patient's posture is erect.   Neurologic exam : The patient is awake and alert, oriented to place and time.   Memory subjective described as intact.  Attention span & concentration ability appears  normal.  Speech is fluent,  without dysarthria, dysphonia or aphasia.  Mood and affect are appropriate.   Cranial nerves: no loss of smell or taste reported  Pupils are equal and briskly reactive to light. Funduscopic exam deferred.  Extraocular movements in vertical and horizontal planes were intact and without nystagmus. No Diplopia. Visual fields by finger perimetry are intact. Hearing was intact to soft voice and finger rubbing.    Facial sensation intact to fine touch.  Facial motor strength is symmetric and tongue and uvula move midline.  Neck ROM : rotation, tilt and flexion extension were normal for age and shoulder shrug was symmetrical.    Motor exam:  Symmetric bulk, tone and ROM.   Normal tone without cog wheeling, symmetric grip strength .   Sensory:  Fine touch and vibration were normal.  Proprioception tested in the upper extremities was normal.   Coordination: Rapid alternating movements in the fingers/hands were of normal speed.  The Finger-to-nose maneuver was intact without evidence of ataxia, dysmetria or tremor.   Gait and station: Patient could rise unassisted from a seated position, walked without assistive device.  Stance is of normal width/ base and the patient turned with 3 steps.  Toe and heel walk were deferred.  Deep tendon reflexes: in the  upper and lower extremities are symmetric and intact.  Babinski response was deferred.         After spending a total time of  45 minutes face to face and additional time for physical and neurologic  examination, review of laboratory studies,  personal review of imaging studies, reports and results of other testing and review of referral information / records as far as provided in visit, I have established the following assessments:  Total ESS = corrected to 16 / 24 points  FSS endorsed at 23/ 63 points.  Denies depression. Has been concerned about addictive behavior.   Narcolepsy with : Automatism, hypnopompic  hallucinations, cataplexy. Sleep paralysis.   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  His risk factors for OSA have increased, BMI is 36, and neck size is large.  3) the patient would be willing to be treated with Xyrem, better once a night dosing giving his sleep cycle.    My Plan is to proceed with:  1) repeat PSG and MSLT , he has to be off stimulant , SSRI and sedatives for 14 days. He is not on any medications right now !  2) healthy weight and wellness - [employer] may cover.   I would like to thank Horald Pollen, MD and Agustina Caroli [address], [place],  [place] [address] for allowing me to meet with and to take care of this pleasant patient.   In short, RADIN RAPTIS is presenting with EDS, shift work, and wants to obtain a Media planner.  I plan to follow up either personally or through our NP within 2-4 months.   CC: I will share my notes with PCP .  Electronically signed by: Larey Seat, MD 06/28/2022 1:14 PM  [facility] Board certified by The [employer] of Sleep Medicine and Diplomate of the [employer] of Sleep Medicine. Board certified In Neurology through the [employer], Fellow of the [employer] of Neurology. Medical Director of [facility].

## 2022-06-28 NOTE — Patient Instructions (Signed)
Narcolepsy Narcolepsy is a neurological disorder that causes people to fall asleep suddenly and without control (have sleep attacks) during the daytime. It is a lifelong disorder. Narcolepsy disrupts the sleep cycle at night, which then causes daytime sleepiness. What are the causes? The cause of narcolepsy is not fully understood, but it may be related to: Low levels of hypocretin, a chemical (neurotransmitter) in the brain that controls sleep and wake cycles. Hypocretin imbalance may be caused by: Abnormal genes that are passed from parent to child (inherited). An autoimmune disease in which the body's defense system (immune system) attacks the brain cells that make hypocretin. Infection, tumor, or injury in the area of the brain that controls sleep. Exposure to poisons (toxins), such as heavy metals, pesticides, and secondhand smoke. What are the signs or symptoms? Symptoms of this condition include: Excessive daytime sleepiness. This is the most common symptom and is usually the first symptom you will notice. This may affect your performance at work or school. Sleep attacks. You may fall asleep in the middle of an activity, especially low-energy activities like reading or watching TV. Feeling like you cannot think clearly and trouble focusing or remembering things. You may also feel depressed. Sudden muscle weakness (cataplexy). When this occurs, your speech may become slurred, or your knees may buckle. Cataplexy is usually triggered by surprise, anger, fear, or laughter. Losing the ability to speak or move (sleep paralysis). This may occur just as you start to fall asleep or wake up. You will be aware of the paralysis. It usually lasts for just a few seconds or minutes. Seeing, hearing, tasting, smelling, or feeling things that are not real (hallucinations). Hallucinations may occur with sleep paralysis. They can happen when you are falling asleep, waking up, or dozing. Trouble staying asleep  at night (insomnia) and restless sleep. How is this diagnosed? This condition may be diagnosed based on: A physical exam to rule out any other problems that may be causing your symptoms. You may be asked to write down your sleeping patterns for several weeks in a sleep diary. This will help your health care provider make a diagnosis. Sleep studies that measure how well your REM sleep is regulated. These tests also measure your heart rate, breathing, movement, and brain waves. These tests include: An overnight sleep study (polysomnogram). A daytime sleep study that is done while you take several naps during the day (multiple sleep latency test, MSLT). This test measures how quickly you fall asleep and how quickly you enter REM sleep. Removal of spinal fluid to measure hypocretin levels. How is this treated? There is no cure for this condition, but treatment can help relieve symptoms. Treatment may include: Lifestyle and sleeping strategies to help you cope with the condition, such as: Exercising regularly. Maintaining a regular sleep schedule. Avoiding caffeine and large meals before bed. Medicines. These may include: Medicines that help keep you awake and alert (stimulants) to fight daytime sleepiness. Medicines that treat depression (antidepressants). These may be used to treat cataplexy. Sodium oxybate. This is a strong medicine to help you relax (sedative) that you may take at night. It can help control daytime sleepiness and cataplexy. Other treatments may include mental health counseling or joining a support group. Follow these instructions at home: Sleeping habits  Get about 8 hours of sleep every night. Go to sleep and get up at about the same time every day. Keep your bedroom dark, quiet, and comfortable. When you feel very tired, take short naps. Schedule naps   so that you take them at about the same time every day. Before bedtime: Avoid bright lights and screens. Relax. Try  activities like reading or taking a warm bath. Activity Get at least 20 minutes of exercise every day. This will help you sleep better at night and reduce daytime sleepiness. Avoid exercising within 3 hours of bedtime. Do not drive or use heavy machinery if you are sleepy. If possible, take a nap before driving. Do not swim or go out on the water without a life jacket. Eating and drinking Do not drink alcohol or caffeinated beverages within 4-5 hours of bedtime. Do not eat a large meal before bedtime. Eat meals at about the same times every day. General instructions  Take over-the-counter and prescription medicines only as told by your health care provider. Keep a sleep diary as told by your health care provider. Tell your employer or teachers that you have narcolepsy. You may be able to adjust your schedule to include time for naps. Do not use any products that contain nicotine or tobacco, such as cigarettes, e-cigarettes, and chewing tobacco. If you need help quitting, ask your health care provider. Keep all follow-up visits as told by your health care provider. This is important. Where to find more information National Institute of Neurological Disorders: www.ninds.nih.gov Contact a health care provider if: Your symptoms are not getting better. You have increasingly high blood pressure (hypertension). You have changes in your heart rhythm. You are having a hard time determining what is real and what is not (psychosis). Get help right away if you: Hurt yourself during a sleep attack or an attack of cataplexy. Have chest pain. Have trouble breathing. These symptoms may represent a serious problem that is an emergency. Do not wait to see if the symptoms will go away. Get medical help right away. Call your local emergency services (911 in the [place] Do not drive yourself to the hospital. Summary Narcolepsy is a neurological disorder that causes people to fall asleep suddenly, and without  control, during the daytime (sleep attacks). It is a lifelong disorder. There is no cure for this condition, but treatment can help relieve symptoms. Go to sleep and get up at about the same time every day. Follow instructions about sleep and activities as told by your health care provider. Take over-the-counter and prescription medicines only as told by your health care provider. This information is not intended to replace advice given to you by your health care provider. Make sure you discuss any questions you have with your health care provider. Document Revised: 09/28/2021 Document Reviewed: 04/04/2019 Elsevier Patient Education  2023 Elsevier Inc. Hypersomnia Hypersomnia is a condition in which a person feels very tired during the day even though the person gets plenty of sleep at night. A person with this condition may take naps during the day and may find it very difficult to wake up from sleep. Hypersomnia may affect a person's ability to think, concentrate, drive, or remember things. What are the causes? The cause of this condition may not be known. Possible causes include: Taking certain medicines. Using drugs or alcohol. Sleep disorders, such as narcolepsy and sleep apnea. Injury to the head, brain, or spinal cord. Tumors. Certain medical conditions. These include: Depression. Diabetes. Gastroesophageal reflux disease (GERD). An underactive thyroid gland (hypothyroidism). What are the signs or symptoms? The main symptoms of hypersomnia include: Feeling very tired throughout the day, regardless of how much sleep you got the night before. Having trouble waking up. Others   may find it difficult to wake you up when you are sleeping. Sleeping for longer and longer periods at a time. Taking naps throughout the day. Other symptoms may include: Feeling restless, anxious, or annoyed. Lacking energy. Having trouble with: Remembering. Speaking. Thinking. Loss of appetite. Seeing,  hearing, tasting, smelling, or feeling things that are not real (hallucinations). How is this diagnosed? This condition may be diagnosed based on: Your symptoms and medical history. Your sleeping habits. Your health care provider may ask you to write down your sleeping habits in a daily sleep log, along with any symptoms you have. A series of tests that are done while you sleep (sleep study or polysomnogram). A test that measures how quickly you can fall asleep during the day (daytime nap study or multiple sleep latency test). How is this treated? This condition may be treated by: Following a regular sleep routine. Making lifestyle changes, such as changing your eating habits, getting regular exercise, and avoiding alcohol or caffeinated beverages. Taking medicines to make you more alert (stimulants) during the day. Treating any underlying medical causes of hypersomnia. Follow these instructions at home: Sleep habits Stick to a routine that includes going to bed and waking up at the same times every day and night. Practice a relaxing bedtime routine. This may include reading, meditation, deep breathing, or taking a warm bath before going to sleep. Exercise regularly as told by your health care provider. However, avoid exercising in the hours right before bedtime. Keep your sleep environment at a cooler temperature, darkened, and quiet. Sleep with pillows and a mattress that are comfortable and supportive. Schedule short 20-minute naps for when you feel sleepiest during the day. Talk with your employer or teachers about your hypersomnia. If possible, adjust your schedule so that: You have a regular daytime work schedule. You can take a scheduled nap during the day. You do not have to work or be active at night. Do not eat a heavy meal for a few hours before bedtime. Eat your meals at about the same times every day. Safety  Do not drive or use machinery if you are sleepy. Ask your health  care provider if it is safe for you to drive. Wear a life jacket when swimming or spending time near water. General instructions  Take over-the-counter and prescription medicines only as told by your health care provider. This includes supplements. Avoid drinking alcohol or caffeinated beverages. Keep a sleep log that will help your health care provider manage your condition. This may include information about: What time you go to bed each night. How often you wake up at night. How many hours you sleep at night. How often and for how long you nap during the day. Any observations from others, such as leg movements during sleep, sleep walking, or snoring. Keep all follow-up visits. This is important. Contact a health care provider if: You have new symptoms. Your symptoms get worse. Get help right away if: You have thoughts about hurting yourself or someone else. Get help right away if you feel like you may hurt yourself or others, or have thoughts about taking your own life. Go to your nearest emergency room or: Call 911. Call the National Suicide Prevention Lifeline at 1-800-273-8255 or 988. This is open 24 hours a day. Text the Crisis Text Line at 741741. Summary Hypersomnia refers to a condition in which you feel very tired during the day even though you get plenty of sleep at night. A person with this condition   may take naps during the day and may find it very difficult to wake up from sleep. Hypersomnia may affect a person's ability to think, concentrate, drive, or remember things. Treatment may include a regular sleep routine and making some lifestyle changes. This information is not intended to replace advice given to you by your health care provider. Make sure you discuss any questions you have with your health care provider. Document Revised: 08/03/2021 Document Reviewed: 08/03/2021 Elsevier Patient Education  2023 Elsevier Inc.  

## 2022-07-05 LAB — NARCOLEPSY EVALUATION
DQA1*01:02: POSITIVE
DQB1*06:02: POSITIVE

## 2022-07-05 NOTE — Progress Notes (Signed)
Double positive narcolepsy HLA factors/ alleles.  Further work up recommended for narcolepsy.

## 2022-07-06 ENCOUNTER — Telehealth: Payer: Self-pay | Admitting: Neurology

## 2022-07-06 NOTE — Telephone Encounter (Signed)
-----  Message from Larey Seat, MD sent at 07/05/2022  5:19 PM EDT ----- Double positive narcolepsy HLA factors/ alleles.  Further work up recommended for narcolepsy.

## 2022-07-06 NOTE — Telephone Encounter (Signed)
Called the patient. There was no answer left a detailed message. The patient called back and I was able to review the lab results with him. Advised to move forward with getting the sleep studies done. Pt verbalized understanding. Pt aware sleep lab is working on getting the insurance authorization and then they will be in touch with getting him scheduled.

## 2022-07-07 ENCOUNTER — Telehealth: Payer: Self-pay | Admitting: Neurology

## 2022-07-07 DIAGNOSIS — R400 Somnolence: Secondary | ICD-10-CM

## 2022-07-07 DIAGNOSIS — Z68.41 Body mass index (BMI) pediatric, greater than or equal to 95th percentile for age: Secondary | ICD-10-CM

## 2022-07-07 NOTE — Telephone Encounter (Signed)
[employer] pending faxed notes  

## 2022-07-08 NOTE — Telephone Encounter (Signed)
I received a fax form stating that both the NPSG and MSLT was denied.  The reason NPSG was denied, "you must have a documented comorbidity such as one of following. Moderate to severe lung disease, moderate to severe heart failure. Other co-morbidity or clinical scenario as listed int he guidelines. Your records show that a home sleep test can be done for you.  The reason for the MSLT was denied, a test similar to the one requested has recently been performed.  I called [employer] and informed them that a MSLT or NPSG has not been performed recently.  They informed me a reconsideration or a p2p can occur. For a reconsideration it would need to be a written letter stating why this test is needed and that this test has not be performed or a peer to peer can be performed.  A p2p phone number is [phone].  The case number is [id number] for MSLT & [id number] for NPSG.

## 2022-07-15 NOTE — Telephone Encounter (Signed)
Noted, faxed information to [facility] for a reconsideration.

## 2022-08-02 ENCOUNTER — Telehealth: Payer: Self-pay | Admitting: Neurology

## 2022-08-02 NOTE — Telephone Encounter (Signed)
Pt is calling. Stated he received a letter from him insurance stated they wouldn't pay for a sleep study. Pt is requesting a call-back from nurse.

## 2022-08-02 NOTE — Telephone Encounter (Signed)
Phillip Yates, I spoke with Dr. Vickey Huger. She approved ordering HST first. I placed order

## 2022-08-02 NOTE — Telephone Encounter (Signed)
I have sent [employer] two reconsideration request. They are still denying the sleep study. Stating that "your recorders show that a home sleep test can be done for you."  I am not sure if a HST should be ordered. But I can't get the NPSG/MSLT approved with [employer].

## 2022-08-03 NOTE — Telephone Encounter (Signed)
I spoke with the patient and informed him that Rosann Auerbach is denying the NPSG/MSLT and per Dr. Vickey Huger she is okay with doing a HST at this time.   I informed him that we will mail out the device to him and once he receives the package in the mail to do the sleep study within 7 days.    HST- [employer] no auth req via auto machine ref # [id number].   Mail out on the schedule for 08/04/22.

## 2022-08-03 NOTE — Telephone Encounter (Signed)
I spoke with the patient and informed him that [employer] is denying the NPSG/MSLT and per Dr. Dohmeier she is okay with doing a HST at this time.   I informed him that we will mail out the device to him and once he receives the package in the mail to do the sleep study within 7 days.    HST- [employer] no auth req via auto machine ref # [id number].   Mail out on the schedule for 08/04/22.      

## 2022-08-04 ENCOUNTER — Ambulatory Visit: Payer: Managed Care, Other (non HMO) | Admitting: Neurology

## 2022-08-04 DIAGNOSIS — Z68.41 Body mass index (BMI) pediatric, greater than or equal to 95th percentile for age: Secondary | ICD-10-CM

## 2022-08-04 DIAGNOSIS — G4733 Obstructive sleep apnea (adult) (pediatric): Secondary | ICD-10-CM | POA: Diagnosis not present

## 2022-08-04 DIAGNOSIS — R400 Somnolence: Secondary | ICD-10-CM

## 2022-08-19 NOTE — Progress Notes (Unsigned)
   [facility]  HOME SLEEP TEST (Watch PAT) REPORT  STUDY DATE: 08/18/22  DOB: 04-05-1996  MRN: [id number]  ORDERING CLINICIAN: Huston Foley, MD, PhD - study read on behalf of Dr. Vickey Huger   REFERRING CLINICIAN: Sagardia, Eilleen Kempf, MD (PCP), Dr. Vickey Huger (Sleep)  CLINICAL INFORMATION/HISTORY: 26 year old male with a history of allergies and obesity and a prior diagnosis of narcolepsy.  Epworth sleepiness score: 16/24.  BMI: 36.1 kg/m  FINDINGS:   Sleep Summary:   Total Recording Time (hours, min): 7 hours, 36 min  Total Sleep Time (hours, min):  6 hours, 33 min  Percent REM (%):    21.5%   Respiratory Indices:   Calculated pAHI (per hour):  24.4/hour         REM pAHI:    62.6/hour       NREM pAHI: 14.3/hour  Central pAHI: 0/hour  Oxygen Saturation Statistics:    Oxygen Saturation (%) Mean: 95%   Minimum oxygen saturation (%):                 88%   O2 Saturation Range (%): 88 - 100%    O2 Saturation (minutes) <=88%: 0.1 min  Pulse Rate Statistics:   Pulse Mean (bpm):    73/min    Pulse Range (54 - 102/min)   IMPRESSION: OSA (obstructive sleep apnea)   RECOMMENDATION:  ***   INTERPRETING PHYSICIAN:   Huston Foley, MD, PhD Medical Director, [facility]) Diplomat, ABPN (Neurology and Sleep)   [facility] [address], [address] [place], [place] [address] [phone]

## 2022-08-23 ENCOUNTER — Telehealth: Payer: Self-pay | Admitting: Neurology

## 2022-08-23 DIAGNOSIS — G4733 Obstructive sleep apnea (adult) (pediatric): Secondary | ICD-10-CM

## 2022-08-23 NOTE — Telephone Encounter (Signed)
This patient saw Dr. Vickey Huger for sleep evaluation on 06/28/2022.  I read the home sleep test from 08/18/2022 on Dr. Oliva Bustard behalf:   Please call and notify the patient that the recent home sleep test showed obstructive sleep apnea in the moderate range. I recommend treatment in the form of autoPAP, which means, that we don't have to bring him in for a sleep study with CPAP, but will let him start using a so called autoPAP machine at home, which is a CPAP-like machine with self-adjusting pressures. We will send the order to a local DME company (of his choice, or as per insurance requirement). The DME representative will fit him with a mask, educate him on how to use the machine, how to put the mask on, etc. I have placed an order in the chart. Please send the order, talk to patient, send report to referring MD. We will need a FU in sleep clinic for 10 weeks post-PAP set up, please arrange that with me or one of our NPs. Also reinforce the need for compliance with treatment. Thanks,   Huston Foley, MD, PhD [facility])

## 2022-08-23 NOTE — Procedures (Signed)
   [facility]  HOME SLEEP TEST (Watch PAT) REPORT  STUDY DATE: 08/18/22  DOB: [birth year]  MRN: [id number]  ORDERING CLINICIAN: Huston Foley, MD, PhD - study read on behalf of Dr. Vickey Huger   REFERRING CLINICIAN: Sagardia, Eilleen Kempf, MD (PCP), Dr. Vickey Huger (Sleep)  CLINICAL INFORMATION/HISTORY: 26 year old male with a history of allergies and obesity and a prior diagnosis of narcolepsy.  Epworth sleepiness score: 16/24.  BMI: 36.1 kg/m  FINDINGS:   Sleep Summary:   Total Recording Time (hours, min): 7 hours, 36 min  Total Sleep Time (hours, min):  6 hours, 33 min  Percent REM (%):    21.5%   Respiratory Indices:   Calculated pAHI (per hour):  24.4/hour         REM pAHI:    62.6/hour       NREM pAHI: 14.3/hour  Central pAHI: 0/hour  Oxygen Saturation Statistics:    Oxygen Saturation (%) Mean: 95%   Minimum oxygen saturation (%):                 88%   O2 Saturation Range (%): 88 - 100%    O2 Saturation (minutes) <=88%: 0.1 min  Pulse Rate Statistics:   Pulse Mean (bpm):    73/min    Pulse Range (54 - 102/min)   IMPRESSION: OSA (obstructive sleep apnea)   RECOMMENDATION:  This home sleep test demonstrates moderate obstructive sleep apnea with a total AHI of 24.4/hour and O2 nadir of 88%. Snoring was detected, ranging from mild to louder. Treatment with a positive airway pressure (PAP) device is recommended. The patient will be advised to proceed with an autoPAP titration/trial at home for now. A full night titration study may be considered to optimize treatment settings, monitor proper oxygen saturations and aid with improvement of tolerance and adherence, if needed down the road. Alternative treatment options may include a dental device through dentistry or orthodontics in selected patients or Inspire (hypoglossal nerve stimulator) in carefully selected patients (meeting inclusion criteria).  Concomitant weight loss is recommended (where  clinically appropriate). Please note that untreated obstructive sleep apnea may carry additional perioperative morbidity. Patients with significant obstructive sleep apnea should receive perioperative PAP therapy and the surgeons and particularly the anesthesiologist should be informed of the diagnosis and the severity of the sleep disordered breathing. The patient should be cautioned not to drive, work at heights, or operate dangerous or heavy equipment when tired or sleepy. Review and reiteration of good sleep hygiene measures should be pursued with any patient. Other causes of the patient's symptoms, including circadian rhythm disturbances, an underlying mood disorder, medication effect and/or an underlying medical problem cannot be ruled out based on this test. Clinical correlation is recommended.  The patient and his referring provider will be notified of the test results. The patient will be seen in follow up in sleep clinic at [facility].  I certify that I have reviewed the raw data recording prior to the issuance of this report in accordance with the standards of the American Academy of Sleep Medicine (AASM).  INTERPRETING PHYSICIAN:   Huston Foley, MD, PhD Medical Director, [facility]) Diplomat, ABPN (Neurology and Sleep)   [facility] [address], [address] [place], [place] [address] [phone]

## 2022-08-25 NOTE — Telephone Encounter (Signed)
I called pt. I advised pt that Dr. Frances Furbish reviewed their sleep study results since Dr. Vickey Huger out and found that pt has sleep apnea. Dr. Frances Furbish recommends that pt start CPAP. I reviewed PAP compliance expectations with the pt. At first, pt hesitant to start therapy, asked if weight loss could resolve OSA. Advised it could help down the road, but currently, best option would be to start therapy. I explained risk factors of untreated OSA. Pt is agreeable to starting a CPAP. I advised pt that an order will be sent to a DME, [facility], and [facility] will call the pt within about one week after they file with the pt's insurance. [facility] will show the pt how to use the machine, fit for masks, and troubleshoot the CPAP if needed. I faxed order to [facility] at [phone], received fax confirmation.  He asked email be sent with their phone#. Advised he is not signed up for mychart.  Sent text message with sign up request. He will sign up and send [place] message once signed up for [place] to send phone# [phone])  Aware phone staff will be reaching out at later time to get initial cpap f/u scheduled.

## 2022-08-25 NOTE — Telephone Encounter (Signed)
For moderate OSA, the first line Rx is really a PAP machine. In fact, using autoPAP may actually help him lose weight and then, when he has achieved a desired amount of weight loss, we can always consider re-testing him with a HST to see, if he still needs treatment for sleep apnea.  If he would like to explore a dental device as an alternative to PAP therapy, we can make a referral to Irene Limbo, DDM or Dr. Myrtis Ser. He is advised to discuss weight loss with PCP as well. Please relay back to pt.

## 2022-08-25 NOTE — Telephone Encounter (Signed)
Called pt. I informed him I needed to schedule him initial CPAP appointment. Pt said do I really need this machine. I am 26 year old and healthy and I will lose weight if I have too. I informed pt I am not a nurse and can't answer them question for him. Pt said I am at work and really can't make this appointment. I told him he could call us back to schedule this okay. He said [place]

## 2022-08-25 NOTE — Telephone Encounter (Signed)
Dr. Frances Furbish- I explained to pt in detail when I spoke with him why treatment was important and risks of untreated OSA. He had agreed to start therapy while I was on the phone but now telling phone staff again he doesn't want therapy. He wants to just lose weight. Any suggestions?

## 2022-08-25 NOTE — Telephone Encounter (Signed)
LVM for pt to call office back.

## 2022-08-26 NOTE — Telephone Encounter (Signed)
Called and spoke with pt again. He feels he is too young to go on CPAP. Explained risks of untreated OSA again. He is going to try and lose weight. Offered dental device option. He would like to discuss things with his father first. Once he has decided on how to proceed, he will call us back.

## 2022-10-04 NOTE — Telephone Encounter (Signed)
Received from [facility] fax that they had made numerous attempts to contact pt for coordinate set up per our order.  Pt has not returned out calls and they are now setting this pt as unable to contact.  They have sent a mailing to pt and to contact their office if he wishes to pursue set up.

## 2023-01-20 ENCOUNTER — Encounter: Payer: Managed Care, Other (non HMO) | Admitting: Neurology

## 2023-02-23 ENCOUNTER — Ambulatory Visit (INDEPENDENT_AMBULATORY_CARE_PROVIDER_SITE_OTHER): Payer: Self-pay | Admitting: Emergency Medicine

## 2023-02-23 ENCOUNTER — Encounter: Payer: Self-pay | Admitting: Emergency Medicine

## 2023-02-23 VITALS — BP 102/64 | HR 78 | Temp 98.3°F | Ht 68.0 in | Wt 249.2 lb

## 2023-02-23 DIAGNOSIS — R040 Epistaxis: Secondary | ICD-10-CM

## 2023-02-23 NOTE — Patient Instructions (Signed)
Use saline nasal spray frequently during the day May use over-the-counter Flonase as needed  Nosebleed, Adult A nosebleed is when blood comes out of the nose. Nosebleeds are common and can be caused by many things. They are usually not a sign of a serious medical problem. Follow these instructions at home: When you have a nosebleed:  Sit down. Tilt your head forward a little. Follow these steps: Pinch your nose with a clean towel or tissue. Keep pinching your nose for 5 minutes. Do not let go. After 5 minutes, let go of your nose. Keep doing these steps until the bleeding stops. Do not put tissues or other things in your nose to stop the bleeding. Avoid lying down or putting your head back. Use a nose spray decongestant as told by your doctor. After a nosebleed: Try not to blow your nose or sniffle for several hours. Try not to strain, lift, or bend at the waist for several days. Aspirin and medicines that thin your blood make bleeding more likely. If you take these medicines: Ask your doctor if you should stop taking them or if you should change how much you take. Do not stop taking the medicine unless your doctor tells you to. If your nosebleed was caused by dryness, use over-the-counter saline nasal spray or gel and a humidifier as told by your doctor. This will keep the inside of your nose moist and allow it to heal. If you need to use nasal spray or gel: Choose one that is water-soluble. Use only as much as you need and use it only as often as needed. Do not lie down right away after you use it. If you get nosebleeds often, talk with your doctor about treatments. These may include: Nasal cautery. A chemical swab or electrical device is used to lightly burn tiny blood vessels inside the nose. This helps stop or prevent nosebleeds. Nasal packing. A gauze or other material is placed in the nose to keep constant pressure on the bleeding area. Contact a doctor if: You have a  fever. You get nosebleeds often. You get nosebleeds more often than usual. You bruise very easily. You have something stuck in your nose. You are bleeding in your mouth. You vomit or cough up brown material. You get a nosebleed after you start a new medicine. Get help right away if: You have a nosebleed after you fall or hurt your head. Your nosebleed does not go away after 20 minutes. You feel dizzy or weak. You have unusual bleeding from other parts of your body. You have unusual bruising on other parts of your body. You get sweaty. You vomit blood. Summary Nosebleeds are common. They are usually not a sign of a serious medical problem. When you have a nosebleed, sit down and tilt your head a little forward. Pinch your nose with a clean tissue for 5 minutes. Use saline spray or saline gel and a humidifier as told by your doctor. Get help right away if your nosebleed does not go away after 20 minutes. This information is not intended to replace advice given to you by your health care provider. Make sure you discuss any questions you have with your health care provider. Document Revised: 09/01/2021 Document Reviewed: 09/01/2021 Elsevier Patient Education  2024 [employer].

## 2023-02-23 NOTE — Progress Notes (Signed)
Phillip Yates 27 y.o.   Chief Complaint  Patient presents with   Epistaxis    Happened a week ago, lasted for 3 minutes Clogged blood when he sneeze     HISTORY OF PRESENT ILLNESS: This is a 27 y.o. 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   Seasonal allergies     Past Surgical History:  Procedure Laterality Date   CIRCUMCISION      Social History   Socioeconomic History   Marital status: Single    Spouse name: Not on file   Number of children: 0   Years of education: 10   Highest education level: Not on file  Occupational History   Occupation: student  Tobacco Use   Smoking status: Never   Smokeless tobacco: Never  Vaping Use   Vaping Use: Never used  Substance and Sexual Activity   Alcohol use: No   Drug use: No   Sexual activity: Not on file  Other Topics Concern   Not on file  Social History Narrative   Lives with parents and younger brother   10th grade [employer]   Patient is left-handed.   Patient drinks tea occasionally.         Social Determinants of Health   Financial Resource Strain: Not on file  Food Insecurity: Not on file  Transportation Needs: Not on file  Physical Activity: Not on file  Stress: Not on file  Social Connections: Not on file  Intimate Partner Violence: Not on file    Family History  Problem Relation Age of Onset   Cancer Maternal Grandfather        lung cancer from smoking   Cancer  Paternal Grandmother    Diabetes Paternal Uncle    Alcohol abuse Neg Hx    Arthritis Neg Hx    Asthma Neg Hx    Birth defects Neg Hx    Depression Neg Hx    COPD Neg Hx    Drug abuse Neg Hx    Early death Neg Hx    Hearing loss Neg Hx    Heart disease Neg Hx    Hyperlipidemia Neg Hx    Hypertension Neg Hx    Kidney disease Neg Hx    Learning disabilities Neg Hx    Mental illness Neg Hx    Mental retardation Neg Hx    Miscarriages / Stillbirths Neg Hx    Stroke Neg Hx    Vision loss Neg Hx    Varicose Veins Neg Hx      Review of Systems  Constitutional: Negative.  Negative for chills and fever.  HENT:  Positive for nosebleeds.   Eyes:  Negative for blurred vision and double vision.  Respiratory: Negative.  Negative for cough and shortness of breath.   Cardiovascular: Negative.  Negative for chest pain and palpitations.  Gastrointestinal:  Negative for abdominal pain, diarrhea,  nausea and vomiting.  Genitourinary: Negative.  Negative for dysuria and hematuria.  Skin: Negative.  Negative for rash.  Neurological: Negative.  Negative for dizziness and headaches.  All other systems reviewed and are negative.   Vitals:   02/23/23 1037  BP: 102/64  Pulse: 78  Temp: 98.3 F (36.8 C)  SpO2: 95%    Physical Exam Constitutional:      Appearance: Normal appearance.  HENT:     Head: Normocephalic.     Right Ear: Tympanic membrane, ear canal and external ear normal.     Left Ear: Tympanic membrane, ear canal and external ear normal.     Nose: Nose normal.     Mouth/Throat:     Mouth: Mucous membranes are moist.     Pharynx: Oropharynx is clear.  Eyes:     Extraocular Movements: Extraocular movements intact.     Conjunctiva/sclera: Conjunctivae normal.     Pupils: Pupils are equal, round, and reactive to light.  Cardiovascular:     Rate and Rhythm: Normal rate and regular rhythm.     Pulses: Normal pulses.     Heart sounds: Normal heart sounds.  Pulmonary:      Effort: Pulmonary effort is normal.     Breath sounds: Normal breath sounds.  Musculoskeletal:     Cervical back: No tenderness.  Lymphadenopathy:     Cervical: No cervical adenopathy.  Skin:    General: Skin is warm and dry.     Capillary Refill: Capillary refill takes less than 2 seconds.  Neurological:     General: No focal deficit present.     Mental Status: He is alert and oriented to person, place, and time.  Psychiatric:        Mood and Affect: Mood normal.        Behavior: Behavior normal.      ASSESSMENT & PLAN: A total of 32 minutes was spent with the patient and counseling/coordination of care regarding preparing for this visit, review of most recent office visit notes, differential diagnosis of nosebleeds and management, ED precautions, prognosis, documentation and need for follow-up.  Problem List Items Addressed This Visit       Other   Frequent nosebleeds - Primary    Clinically stable.  No active epistaxis at present time Unremarkable exam. Recommend to use saline nasal spray frequently during the day and Flonase as needed May need ENT evaluation      Patient Instructions  Use saline nasal spray frequently during the day May use over-the-counter Flonase as needed  Nosebleed, Adult A nosebleed is when blood comes out of the nose. Nosebleeds are common and can be caused by many things. They are usually not a sign of a serious medical problem. Follow these instructions at home: When you have a nosebleed:  Sit down. Tilt your head forward a little. Follow these steps: Pinch your nose with a clean towel or tissue. Keep pinching your nose for 5 minutes. Do not let go. After 5 minutes, let go of your nose. Keep doing these steps until the bleeding stops. Do not put tissues or other things in your nose to stop the bleeding. Avoid lying down or putting your head back. Use a nose spray decongestant as told by your doctor. After a nosebleed: Try not to blow  your nose or sniffle for several hours. Try not to strain, lift, or bend at the waist for several days. Aspirin and medicines that thin your blood make bleeding more likely. If  you take these medicines: Ask your doctor if you should stop taking them or if you should change how much you take. Do not stop taking the medicine unless your doctor tells you to. If your nosebleed was caused by dryness, use over-the-counter saline nasal spray or gel and a humidifier as told by your doctor. This will keep the inside of your nose moist and allow it to heal. If you need to use nasal spray or gel: Choose one that is water-soluble. Use only as much as you need and use it only as often as needed. Do not lie down right away after you use it. If you get nosebleeds often, talk with your doctor about treatments. These may include: Nasal cautery. A chemical swab or electrical device is used to lightly burn tiny blood vessels inside the nose. This helps stop or prevent nosebleeds. Nasal packing. A gauze or other material is placed in the nose to keep constant pressure on the bleeding area. Contact a doctor if: You have a fever. You get nosebleeds often. You get nosebleeds more often than usual. You bruise very easily. You have something stuck in your nose. You are bleeding in your mouth. You vomit or cough up brown material. You get a nosebleed after you start a new medicine. Get help right away if: You have a nosebleed after you fall or hurt your head. Your nosebleed does not go away after 20 minutes. You feel dizzy or weak. You have unusual bleeding from other parts of your body. You have unusual bruising on other parts of your body. You get sweaty. You vomit blood. Summary Nosebleeds are common. They are usually not a sign of a serious medical problem. When you have a nosebleed, sit down and tilt your head a little forward. Pinch your nose with a clean tissue for 5 minutes. Use saline spray or saline  gel and a humidifier as told by your doctor. Get help right away if your nosebleed does not go away after 20 minutes. This information is not intended to replace advice given to you by your health care provider. Make sure you discuss any questions you have with your health care provider. Document Revised: 09/01/2021 Document Reviewed: 09/01/2021 Elsevier Patient Education  2024 Elsevier Inc.   Edwina Barth, MD [facility]

## 2023-02-23 NOTE — Assessment & Plan Note (Signed)
Clinically stable.  No active epistaxis at present time Unremarkable exam. Recommend to use saline nasal spray frequently during the day and Flonase as needed May need ENT evaluation

## 2023-03-09 ENCOUNTER — Encounter: Payer: Managed Care, Other (non HMO) | Admitting: Neurology

## 2023-06-21 ENCOUNTER — Encounter: Payer: Managed Care, Other (non HMO) | Admitting: Emergency Medicine

## 2023-06-28 ENCOUNTER — Ambulatory Visit (INDEPENDENT_AMBULATORY_CARE_PROVIDER_SITE_OTHER): Payer: Self-pay | Admitting: Emergency Medicine

## 2023-06-28 ENCOUNTER — Encounter: Payer: Self-pay | Admitting: Emergency Medicine

## 2023-06-28 VITALS — BP 104/76 | HR 75 | Temp 97.6°F | Ht 68.0 in | Wt 249.0 lb

## 2023-06-28 DIAGNOSIS — Z13220 Encounter for screening for lipoid disorders: Secondary | ICD-10-CM | POA: Diagnosis not present

## 2023-06-28 DIAGNOSIS — Z13228 Encounter for screening for other metabolic disorders: Secondary | ICD-10-CM

## 2023-06-28 DIAGNOSIS — Z130 Encounter for screening for diseases of the blood and blood-forming organs and certain disorders involving the immune mechanism: Secondary | ICD-10-CM | POA: Diagnosis not present

## 2023-06-28 DIAGNOSIS — Z0000 Encounter for general adult medical examination without abnormal findings: Secondary | ICD-10-CM | POA: Diagnosis not present

## 2023-06-28 DIAGNOSIS — Z1329 Encounter for screening for other suspected endocrine disorder: Secondary | ICD-10-CM

## 2023-06-28 LAB — COMPREHENSIVE METABOLIC PANEL
ALT: 14 U/L (ref 0–53)
AST: 16 U/L (ref 0–37)
Albumin: 4.2 g/dL (ref 3.5–5.2)
Alkaline Phosphatase: 68 U/L (ref 39–117)
BUN: 15 mg/dL (ref 6–23)
CO2: 29 meq/L (ref 19–32)
Calcium: 9.6 mg/dL (ref 8.4–10.5)
Chloride: 102 meq/L (ref 96–112)
Creatinine, Ser: 0.91 mg/dL (ref 0.40–1.50)
GFR: 117.27 mL/min (ref >60.00)
Glucose, Bld: 89 mg/dL (ref 70–99)
Potassium: 4.2 meq/L (ref 3.5–5.1)
Sodium: 138 meq/L (ref 135–145)
Total Bilirubin: 0.9 mg/dL (ref 0.2–1.2)
Total Protein: 8 g/dL (ref 6.0–8.3)

## 2023-06-28 LAB — HEMOGLOBIN A1C: Hgb A1c MFr Bld: 5.5 % (ref 4.6–6.5)

## 2023-06-28 LAB — CBC WITH DIFFERENTIAL/PLATELET
Basophils Absolute: 0 10*3/uL (ref 0.0–0.1)
Basophils Relative: 0.4 % (ref 0.0–3.0)
Eosinophils Absolute: 0.1 10*3/uL (ref 0.0–0.7)
Eosinophils Relative: 2.1 % (ref 0.0–5.0)
HCT: 44.5 % (ref 39.0–52.0)
Hemoglobin: 14.6 g/dL (ref 13.0–17.0)
Lymphocytes Relative: 36.7 % (ref 12.0–46.0)
Lymphs Abs: 2.4 10*3/uL (ref 0.7–4.0)
MCHC: 32.9 g/dL (ref 30.0–36.0)
MCV: 85.2 fL (ref 78.0–100.0)
Monocytes Absolute: 0.5 10*3/uL (ref 0.1–1.0)
Monocytes Relative: 7.5 % (ref 3.0–12.0)
Neutro Abs: 3.5 10*3/uL (ref 1.4–7.7)
Neutrophils Relative %: 53.3 % (ref 43.0–77.0)
Platelets: 277 10*3/uL (ref 150.0–400.0)
RBC: 5.22 Mil/uL (ref 4.22–5.81)
RDW: 14.6 % (ref 11.5–15.5)
WBC: 6.6 10*3/uL (ref 4.0–10.5)

## 2023-06-28 LAB — LIPID PANEL
Cholesterol: 179 mg/dL (ref 0–200)
HDL: 33.4 mg/dL — ABNORMAL LOW (ref >39.00)
LDL Cholesterol: 110 mg/dL — ABNORMAL HIGH (ref 0–99)
NonHDL: 145.13
Total CHOL/HDL Ratio: 5
Triglycerides: 177 mg/dL — ABNORMAL HIGH (ref 0.0–149.0)
VLDL: 35.4 mg/dL (ref 0.0–40.0)

## 2023-06-28 NOTE — Patient Instructions (Signed)
Health Maintenance, Male Adopting a healthy lifestyle and getting preventive care are important in promoting health and wellness. Ask your health care provider about: The right schedule for you to have regular tests and exams. Things you can do on your own to prevent diseases and keep yourself healthy. What should I know about diet, weight, and exercise? Eat a healthy diet  Eat a diet that includes plenty of vegetables, fruits, low-fat dairy products, and lean protein. Do not eat a lot of foods that are high in solid fats, added sugars, or sodium. Maintain a healthy weight Body mass index (BMI) is a measurement that can be used to identify possible weight problems. It estimates body fat based on height and weight. Your health care provider can help determine your BMI and help you achieve or maintain a healthy weight. Get regular exercise Get regular exercise. This is one of the most important things you can do for your health. Most adults should: Exercise for at least 150 minutes each week. The exercise should increase your heart rate and make you sweat (moderate-intensity exercise). Do strengthening exercises at least twice a week. This is in addition to the moderate-intensity exercise. Spend less time sitting. Even light physical activity can be beneficial. Watch cholesterol and blood lipids Have your blood tested for lipids and cholesterol at 25 years of age, then have this test every 5 years. You may need to have your cholesterol levels checked more often if: Your lipid or cholesterol levels are high. You are older than 25 years of age. You are at high risk for heart disease. What should I know about cancer screening? Many types of cancers can be detected early and may often be prevented. Depending on your health history and family history, you may need to have cancer screening at various ages. This may include screening for: Colorectal cancer. Prostate cancer. Skin cancer. Lung  cancer. What should I know about heart disease, diabetes, and high blood pressure? Blood pressure and heart disease High blood pressure causes heart disease and increases the risk of stroke. This is more likely to develop in people who have high blood pressure readings or are overweight. Talk with your health care provider about your target blood pressure readings. Have your blood pressure checked: Every 3-5 years if you are 18-39 years of age. Every year if you are 40 years old or older. If you are between the ages of 65 and 75 and are a current or former smoker, ask your health care provider if you should have a one-time screening for abdominal aortic aneurysm (AAA). Diabetes Have regular diabetes screenings. This checks your fasting blood sugar level. Have the screening done: Once every three years after age 45 if you are at a normal weight and have a low risk for diabetes. More often and at a younger age if you are overweight or have a high risk for diabetes. What should I know about preventing infection? Hepatitis B If you have a higher risk for hepatitis B, you should be screened for this virus. Talk with your health care provider to find out if you are at risk for hepatitis B infection. Hepatitis C Blood testing is recommended for: Everyone born from 1945 through 1965. Anyone with known risk factors for hepatitis C. Sexually transmitted infections (STIs) You should be screened each year for STIs, including gonorrhea and chlamydia, if: You are sexually active and are younger than 24 years of age. You are older than 24 years of age and your   health care provider tells you that you are at risk for this type of infection. Your sexual activity has changed since you were last screened, and you are at increased risk for chlamydia or gonorrhea. Ask your health care provider if you are at risk. Ask your health care provider about whether you are at high risk for HIV. Your health care provider  may recommend a prescription medicine to help prevent HIV infection. If you choose to take medicine to prevent HIV, you should first get tested for HIV. You should then be tested every 3 months for as long as you are taking the medicine. Follow these instructions at home: Alcohol use Do not drink alcohol if your health care provider tells you not to drink. If you drink alcohol: Limit how much you have to 0-2 drinks a day. Know how much alcohol is in your drink. In the [place] one drink equals one 12 oz bottle of beer (355 mL), one 5 oz glass of wine (148 mL), or one 1 oz glass of hard liquor (44 mL). Lifestyle Do not use any products that contain nicotine or tobacco. These products include cigarettes, chewing tobacco, and vaping devices, such as e-cigarettes. If you need help quitting, ask your health care provider. Do not use street drugs. Do not share needles. Ask your health care provider for help if you need support or information about quitting drugs. General instructions Schedule regular health, dental, and eye exams. Stay current with your vaccines. Tell your health care provider if: You often feel depressed. You have ever been abused or do not feel safe at home. Summary Adopting a healthy lifestyle and getting preventive care are important in promoting health and wellness. Follow your health care provider's instructions about healthy diet, exercising, and getting tested or screened for diseases. Follow your health care provider's instructions on monitoring your cholesterol and blood pressure. This information is not intended to replace advice given to you by your health care provider. Make sure you discuss any questions you have with your health care provider. Document Revised: 01/12/2021 Document Reviewed: 01/12/2021 Elsevier Patient Education  2024 [employer]  

## 2023-06-28 NOTE — Progress Notes (Signed)
Phillip Yates 27 y.o.   Chief Complaint  Patient presents with   Annual Exam    Discuss narcolepsy paperwork for job     HISTORY OF PRESENT ILLNESS: This is a 27 y.o. male here for annual exam. Has no complaints or medical concerns today. Has history of narcolepsy.  Follows up with Dr. Porfirio Mylar Dohmeier at [facility].  HPI   Prior to Admission medications   Not on File    No Known Allergies  Patient Active Problem List   Diagnosis Date Noted   Frequent nosebleeds 11/17/2016   BMI (body mass index), pediatric, 95-99% for age 37/22/2017   Narcolepsy 11/02/2013   Daytime somnolence 07/28/2013    Past Medical History:  Diagnosis Date   Acne    ADHD (attention deficit hyperactivity disorder)    Allergy    Daytime somnolence    Isosexual precocious puberty    Narcolepsy    Narcolepsy 11/02/2013   Seasonal allergies     Past Surgical History:  Procedure Laterality Date   CIRCUMCISION      Social History   Socioeconomic History   Marital status: Single    Spouse name: Not on file   Number of children: 0   Years of education: 10   Highest education level: Not on file  Occupational History   Occupation: student  Tobacco Use   Smoking status: Never   Smokeless tobacco: Never  Vaping Use   Vaping status: Never Used  Substance and Sexual Activity   Alcohol use: No   Drug use: No   Sexual activity: Not on file  Other Topics Concern   Not on file  Social History Narrative   Lives with parents and younger brother   10th grade [employer]   Patient is left-handed.   Patient drinks tea occasionally.         Social Determinants of Health   Financial Resource Strain: Not on file  Food Insecurity: Not on file  Transportation Needs: Not on file  Physical Activity: Not on file  Stress: Not on file  Social Connections: Not on file  Intimate Partner Violence: Not on file    Family History  Problem Relation Age of Onset   Cancer Maternal Grandfather        lung  cancer from smoking   Cancer Paternal Grandmother    Diabetes Paternal Uncle    Alcohol abuse Neg Hx    Arthritis Neg Hx    Asthma Neg Hx    Birth defects Neg Hx    Depression Neg Hx    COPD Neg Hx    Drug abuse Neg Hx    Early death Neg Hx    Hearing loss Neg Hx    Heart disease Neg Hx    Hyperlipidemia Neg Hx    Hypertension Neg Hx    Kidney disease Neg Hx    Learning disabilities Neg Hx    Mental illness Neg Hx    Mental retardation Neg Hx    Miscarriages / Stillbirths Neg Hx    Stroke Neg Hx    Vision loss Neg Hx    Varicose Veins Neg Hx      Review of Systems  Constitutional: Negative.  Negative for chills and fever.  HENT: Negative.  Negative for congestion and sore throat.   Respiratory: Negative.  Negative for cough and shortness of breath.   Cardiovascular: Negative.  Negative for chest pain and palpitations.  Gastrointestinal:  Negative for abdominal pain, diarrhea, nausea and  vomiting.  Genitourinary: Negative.  Negative for dysuria and hematuria.  Skin: Negative.  Negative for rash.  Neurological: Negative.  Negative for dizziness and headaches.  All other systems reviewed and are negative.   Vitals:   06/28/23 1348  BP: 104/76  Pulse: 75  Temp: 97.6 F (36.4 C)  SpO2: 98%    Physical Exam Vitals reviewed.  Constitutional:      Appearance: Normal appearance.  HENT:     Head: Normocephalic.     Right Ear: Tympanic membrane, ear canal and external ear normal.     Left Ear: Tympanic membrane, ear canal and external ear normal.     Mouth/Throat:     Mouth: Mucous membranes are moist.     Pharynx: Oropharynx is clear.  Eyes:     Extraocular Movements: Extraocular movements intact.     Conjunctiva/sclera: Conjunctivae normal.     Pupils: Pupils are equal, round, and reactive to light.  Cardiovascular:     Rate and Rhythm: Normal rate and regular rhythm.     Pulses: Normal pulses.     Heart sounds: Normal heart sounds.  Pulmonary:     Effort:  Pulmonary effort is normal.     Breath sounds: Normal breath sounds.  Abdominal:     Palpations: Abdomen is soft.     Tenderness: There is no abdominal tenderness.  Musculoskeletal:     Cervical back: No tenderness.  Lymphadenopathy:     Cervical: No cervical adenopathy.  Skin:    General: Skin is warm and dry.     Capillary Refill: Capillary refill takes less than 2 seconds.  Neurological:     General: No focal deficit present.     Mental Status: He is alert and oriented to person, place, and time.  Psychiatric:        Mood and Affect: Mood normal.        Behavior: Behavior normal.      ASSESSMENT & PLAN: Problem List Items Addressed This Visit   None Visit Diagnoses     Routine general medical examination at a health care facility    -  Primary   Relevant Orders   CBC with Differential   Comprehensive metabolic panel   Hemoglobin A1c   Lipid panel   Screening for deficiency anemia       Relevant Orders   CBC with Differential   Screening for lipoid disorders       Relevant Orders   Lipid panel   Screening for endocrine, metabolic and immunity disorder       Relevant Orders   Comprehensive metabolic panel   Hemoglobin A1c      Modifiable risk factors discussed with patient. Anticipatory guidance according to age provided. The following topics were also discussed: Social Determinants of Health Smoking.  Non-smoker Diet and nutrition Benefits of exercise Cancer family history review Vaccinations review and recommendations Cardiovascular risk assessment and blood work Mental health including depression and anxiety Fall and accident prevention  Patient Instructions  Health Maintenance, Male Adopting a healthy lifestyle and getting preventive care are important in promoting health and wellness. Ask your health care provider about: The right schedule for you to have regular tests and exams. Things you can do on your own to prevent diseases and keep yourself  healthy. What should I know about diet, weight, and exercise? Eat a healthy diet  Eat a diet that includes plenty of vegetables, fruits, low-fat dairy products, and lean protein. Do not eat a lot of  foods that are high in solid fats, added sugars, or sodium. Maintain a healthy weight Body mass index (BMI) is a measurement that can be used to identify possible weight problems. It estimates body fat based on height and weight. Your health care provider can help determine your BMI and help you achieve or maintain a healthy weight. Get regular exercise Get regular exercise. This is one of the most important things you can do for your health. Most adults should: Exercise for at least 150 minutes each week. The exercise should increase your heart rate and make you sweat (moderate-intensity exercise). Do strengthening exercises at least twice a week. This is in addition to the moderate-intensity exercise. Spend less time sitting. Even light physical activity can be beneficial. Watch cholesterol and blood lipids Have your blood tested for lipids and cholesterol at 25 years of age, then have this test every 5 years. You may need to have your cholesterol levels checked more often if: Your lipid or cholesterol levels are high. You are older than 25 years of age. You are at high risk for heart disease. What should I know about cancer screening? Many types of cancers can be detected early and may often be prevented. Depending on your health history and family history, you may need to have cancer screening at various ages. This may include screening for: Colorectal cancer. Prostate cancer. Skin cancer. Lung cancer. What should I know about heart disease, diabetes, and high blood pressure? Blood pressure and heart disease High blood pressure causes heart disease and increases the risk of stroke. This is more likely to develop in people who have high blood pressure readings or are overweight. Talk with  your health care provider about your target blood pressure readings. Have your blood pressure checked: Every 3-5 years if you are 84-67 years of age. Every year if you are 66 years old or older. If you are between the ages of 76 and 66 and are a current or former smoker, ask your health care provider if you should have a one-time screening for abdominal aortic aneurysm (AAA). Diabetes Have regular diabetes screenings. This checks your fasting blood sugar level. Have the screening done: Once every three years after age 76 if you are at a normal weight and have a low risk for diabetes. More often and at a younger age if you are overweight or have a high risk for diabetes. What should I know about preventing infection? Hepatitis B If you have a higher risk for hepatitis B, you should be screened for this virus. Talk with your health care provider to find out if you are at risk for hepatitis B infection. Hepatitis C Blood testing is recommended for: Everyone born from [birth year] through 1963. Anyone with known risk factors for hepatitis C. Sexually transmitted infections (STIs) You should be screened each year for STIs, including gonorrhea and chlamydia, if: You are sexually active and are younger than 25 years of age. You are older than 25 years of age and your health care provider tells you that you are at risk for this type of infection. Your sexual activity has changed since you were last screened, and you are at increased risk for chlamydia or gonorrhea. Ask your health care provider if you are at risk. Ask your health care provider about whether you are at high risk for HIV. Your health care provider may recommend a prescription medicine to help prevent HIV infection. If you choose to take medicine to prevent HIV, you should  first get tested for HIV. You should then be tested every 3 months for as long as you are taking the medicine. Follow these instructions at home: Alcohol use Do not drink  alcohol if your health care provider tells you not to drink. If you drink alcohol: Limit how much you have to 0-2 drinks a day. Know how much alcohol is in your drink. In the [place] one drink equals one 12 oz bottle of beer (355 mL), one 5 oz glass of wine (148 mL), or one 1 oz glass of hard liquor (44 mL). Lifestyle Do not use any products that contain nicotine or tobacco. These products include cigarettes, chewing tobacco, and vaping devices, such as e-cigarettes. If you need help quitting, ask your health care provider. Do not use street drugs. Do not share needles. Ask your health care provider for help if you need support or information about quitting drugs. General instructions Schedule regular health, dental, and eye exams. Stay current with your vaccines. Tell your health care provider if: You often feel depressed. You have ever been abused or do not feel safe at home. Summary Adopting a healthy lifestyle and getting preventive care are important in promoting health and wellness. Follow your health care provider's instructions about healthy diet, exercising, and getting tested or screened for diseases. Follow your health care provider's instructions on monitoring your cholesterol and blood pressure. This information is not intended to replace advice given to you by your health care provider. Make sure you discuss any questions you have with your health care provider. Document Revised: 01/12/2021 Document Reviewed: 01/12/2021 Elsevier Patient Education  2024 Elsevier Inc.     Edwina Barth, MD [facility]

## 2023-07-05 ENCOUNTER — Telehealth: Payer: Self-pay | Admitting: Neurology

## 2023-07-05 NOTE — Telephone Encounter (Signed)
Pt called to schedule a follow up and also need letter for employer to verify he has narcolepsy. Have scheduled with Shanda Bumps, NP  on 07/11/23 to be able for him to be seen this year.

## 2023-07-05 NOTE — Telephone Encounter (Signed)
Contacted pt back. He stated he just needs a letter staying he is being treated for narcolepsy, nothing else specific. He would like to come pick it up today due to appt being Monday and he needed it to leave work to come to the appt. I informed him he would likely be end of day due to MD seeing pts all day. He also denies taking anything for the Narcolepsy nor does he want to pursue any treatment at this time. Letter will be provided and FU was cancelled. Pt would still like letter although appt will be cancelled. He verbally understood and was appreciative.

## 2023-07-05 NOTE — Telephone Encounter (Signed)
Patient is not currently on medications for his narcolepsy. Looks like he tested positive for sleep apnea on recent HST and this is usually required to be treated first prior to use of medications for narcolepsy (if this is something he is interested in pursing) but patient declined treatment per prior telephone note. Please advise patient of this, a follow up visit may not be required. In regards to a letter, he had previously been diagnosed with narcolepsy by Dr. Vickey Huger so a letter can be provided even without a follow up visit. Thank you.

## 2023-07-05 NOTE — Telephone Encounter (Signed)
Letter is at front desk for pt pick up

## 2023-07-11 ENCOUNTER — Ambulatory Visit: Payer: Managed Care, Other (non HMO) | Admitting: Adult Health

## 2024-04-24 ENCOUNTER — Ambulatory Visit (INDEPENDENT_AMBULATORY_CARE_PROVIDER_SITE_OTHER): Payer: Self-pay | Admitting: Adult Health

## 2024-04-24 ENCOUNTER — Encounter: Payer: Self-pay | Admitting: Adult Health

## 2024-04-24 VITALS — BP 118/64 | HR 82 | Ht 68.0 in | Wt 255.0 lb

## 2024-04-24 DIAGNOSIS — G47419 Narcolepsy without cataplexy: Secondary | ICD-10-CM

## 2024-04-24 DIAGNOSIS — G4733 Obstructive sleep apnea (adult) (pediatric): Secondary | ICD-10-CM

## 2024-04-24 NOTE — Progress Notes (Signed)
 [facility] [address] [place]. [place] [address] [phone]       OFFICE FOLLOW UP NOTE  Mr. Phillip Yates Date of Birth:  09/14/1995 Medical Record Number:  [id number]    Primary neurologist: Dr. Chalice Reason for visit: Narcolepsy    SUBJECTIVE:  CHIEF COMPLAINT:  Chief Complaint  Patient presents with   Follow-up    Pt alone, rm 8. Pt is here today to discuss his medical condition.     Follow-up visit:  Prior visit: 06/28/2022 with Dr. Chalice  Brief HPI:   Phillip Yates is a 28 y.o. male who was diagnosed with narcolepsy in 2015 by Dr. Chalice after MSLT, no indication of sleep apnea at that time.  He was prescribed armodafinil  at follow-up visit with Dr. Chalice but did not have follow-up after that time.  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  Narcolepsy panel positive.  Insurance declined NPSG/MSLT therefore proceeded with HST 07/2022 showed moderate OSA with total AHI of 24.4/h and O2 nadir of 88%.  Recommended treatment with AutoPap therapy although patient declined treatment with CPAP, he preferred pursuing weight loss.  Offered oral appliance evaluation but patient declined interest at that time as well.   Interval history:  Patient returns today requesting forms to be completed by his job relating to his narcolepsy. He reports recent event at his work. He currently drives a forklift. Reports sitting in a quiet room as he did not have any work to be done at that time and he ended up drifting off to sleep. He is now on suspension until he is cleared from a neurology standpoint.  He reports no issues driving a forklift, denies ever falling asleep while driving a forklift or any other motorized vehicle. He has previously felt a little sleepy while operating a forklift but is very aware of this, he will stop and take a break, drink cold water and move around,  this will help re energize him.  He denies any cataplectic events.  ESS 15/24. FSS 23/63. He is willing to proceed with treatment of sleep apnea if needed or do repeat testing if this is needed. He does admit to weight gain since prior study in 2023 (approx 17 lbs).        ROS:   14 system review of systems performed and negative with exception of those listed in HPI  PMH:  Past Medical History:  Diagnosis Date   Acne    ADHD (attention deficit hyperactivity disorder)    Allergy    Daytime somnolence    Isosexual precocious puberty    Narcolepsy    Narcolepsy 11/02/2013   Seasonal allergies     PSH:  Past Surgical History:  Procedure Laterality Date   CIRCUMCISION      Social History:  Social History   Socioeconomic History   Marital status: Single    Spouse name: Not on file   Number of children: 0   Years of education: 10   Highest education level: Not on file  Occupational History   Occupation: student  Tobacco Use   Smoking status: Never   Smokeless tobacco: Never  Vaping Use   Vaping status: Never Used  Substance and Sexual Activity   Alcohol use: No   Drug use: No   Sexual activity: Not on file  Other Topics Concern   Not on file  Social History Narrative   Lives with  parents and younger brother   39th grade [employer]   Patient is left-handed.   Patient drinks tea occasionally.         Social Drivers of Corporate investment banker Strain: Not on file  Food Insecurity: Not on file  Transportation Needs: Not on file  Physical Activity: Not on file  Stress: Not on file  Social Connections: Not on file  Intimate Partner Violence: Not on file    Family History:  Family History  Problem Relation Age of Onset   Cancer Maternal Grandfather        lung cancer from smoking   Cancer Paternal Grandmother    Diabetes Paternal Uncle    Alcohol abuse Neg Hx    Arthritis Neg Hx    Asthma Neg Hx    Birth defects Neg Hx    Depression Neg Hx    COPD  Neg Hx    Drug abuse Neg Hx    Early death Neg Hx    Hearing loss Neg Hx    Heart disease Neg Hx    Hyperlipidemia Neg Hx    Hypertension Neg Hx    Kidney disease Neg Hx    Learning disabilities Neg Hx    Mental illness Neg Hx    Mental retardation Neg Hx    Miscarriages / Stillbirths Neg Hx    Stroke Neg Hx    Vision loss Neg Hx    Varicose Veins Neg Hx     Medications:   No current outpatient medications on file prior to visit.   No current facility-administered medications on file prior to visit.    Allergies:  No Known Allergies    OBJECTIVE:  Physical Exam  Vitals:   04/24/24 1448  BP: 118/64  Pulse: 82  Weight: 255 lb (115.7 kg)  Height: 5' 8 (1.727 m)   Body mass index is 38.77 kg/m. No results found.   General: well developed, well nourished, very pleasant young African-American male, seated, in no evident distress  Neurologic Exam Mental Status: Awake and fully alert. Oriented to place and time. Recent and remote memory intact. Attention span, concentration and fund of knowledge appropriate. Mood and affect appropriate.  Cranial Nerves: Pupils equal, briskly reactive to light. Extraocular movements full without nystagmus. Visual fields full to confrontation. Hearing intact. Facial sensation intact. Face, tongue, palate moves normally and symmetrically.  Motor: Normal bulk and tone. Normal strength in all tested extremity muscles Gait and Station: Arises from chair without difficulty. Stance is normal. Gait demonstrates normal stride length and balance without use of AD.  Reflexes: 1+ and symmetric. Toes downgoing.         ASSESSMENT/PLAN: Phillip Yates is a 28 y.o. year old male      Narcolepsy without cataplexy:  OSA: Diagnosed with narcolepsy in 2015, attempted for repeat NPSG/MSLT in 06/2022 but insurance denied, proceeded with HST which showed moderate OSA although patient declined CPAP therapy. Currently on work suspension due to  recent event where he fell asleep in a quiet room. Work concerned as he does drive a forklift although he denies this ever occurring while operating any type of motorized vehicle. His work is requesting accommodation paperwork to be completed We will review paperwork with Dr. Chalice and further discuss need of further workup/testing He is agreeable to initiate CPAP if needed for treatment of underlying sleep apnea as this could be contributing to underlying daytime sleepiness He does not wish to use any medications for his narcolepsy.  Previously tried armodafinil .     Follow-up will be determined after further discussion with Dr. Chalice   CC:  PCP: Purcell Emil Schanz, MD    I personally spent a total of 40 minutes in the care of the patient today including preparing to see the patient, getting/reviewing separately obtained history, performing a medically appropriate exam/evaluation, counseling and educating, referring and communicating with other health care professionals, and documenting clinical information in the EHR.  Harlene Bogaert, AGNP-BC  [facility] [address] [place], [place] [address]  Phone [phone] Fax [phone] Note: This document was prepared with digital dictation and possible smart phrase technology. Any transcriptional errors that result from this process are unintentional.

## 2024-04-24 NOTE — Patient Instructions (Addendum)
 Your Plan:  Will follow up with Dr. Chalice regarding ability to return to work at this time and completion of paperwork. We may possibly need to proceed with repeat sleep study and further evaluation prior to return to work but we will keep you updated.        Thank you for coming to see us  at [facility]. I hope we have been able to provide you high quality care today.  You may receive a patient satisfaction survey over the next few weeks. We would appreciate your feedback and comments so that we may continue to improve ourselves and the health of our patients.

## 2024-04-25 ENCOUNTER — Other Ambulatory Visit: Payer: Self-pay | Admitting: Neurology

## 2024-04-25 DIAGNOSIS — G47419 Narcolepsy without cataplexy: Secondary | ICD-10-CM

## 2024-04-25 DIAGNOSIS — G4719 Other hypersomnia: Secondary | ICD-10-CM

## 2024-04-25 DIAGNOSIS — G47411 Narcolepsy with cataplexy: Secondary | ICD-10-CM

## 2024-04-25 DIAGNOSIS — R400 Somnolence: Secondary | ICD-10-CM

## 2024-04-25 DIAGNOSIS — G4733 Obstructive sleep apnea (adult) (pediatric): Secondary | ICD-10-CM

## 2024-04-25 MED ORDER — ARMODAFINIL 250 MG PO TABS: 250.0000 mg | ORAL_TABLET | Freq: Every day | ORAL | 5 refills | Status: AC

## 2024-04-25 NOTE — Telephone Encounter (Signed)
 I restarted Armodafinil  for this patient, preventing sleep onset during his work hours- he will need to undergo auto CPAP treatment for apnea now If we can't get him into a SPLIT study , SPLIT at AHI 15/h .mask fitting for better tolerance.   Phillip Yates's daytime sleepiness has affected his work.

## 2024-04-25 NOTE — Telephone Encounter (Signed)
 Form completed for the patient and given to medical records for the pt to pick up

## 2024-04-25 NOTE — Telephone Encounter (Signed)
 Called the patient to advise of the discussion with Dr Chalice. Dr Chalice does agree with the most recent sleep study in 2023 indicating that he had Sleep apnea she would like for him to treat the sleep apnea. Advised I will send  the order to [facility] indicating the need for CPAP treatment. Advised that if they kick back stating a sleep study is needed, we will place the order and let him know. Informed a medication armodafinil  will be called into the pharmacy for the pt to start I the meantime while trying to get set up on treatment.   Pt called in stating currently his insurance is not active. He is working with employer to see about getting this reinstated. Will advise [facility] of this information.

## 2024-05-09 NOTE — Telephone Encounter (Signed)
 Received a call from the HR person and was able to discuss the accommodations and requirements recommended by MD.

## 2024-05-09 NOTE — Telephone Encounter (Signed)
 Pt called to speak to Nurse about  calling job  too give verbal confirmation that Office filled out Paperwork   For pt ,Pt requested to speak to Nurse

## 2024-05-09 NOTE — Telephone Encounter (Signed)
 Called the patient back. He states that he was going to merge the call with HR to advise of them that pt is ok to return to work. There was no answer and so I left a detailed message advising this information and provided a phone number for the HR person to call back.

## 2024-05-10 NOTE — Progress Notes (Signed)
  This Patient is not currently on medications for his narcolepsy. When originally worked up the patient declined initially the appropriate tests of PSG and MSLT,but then allowed these  in 2015, he was not having apnea at that time-but had  excessive daytime sleepiness, ESS 20/ 24 as a high school student . After he received his dx of narcolepsy he declined further treatment.   In 2023 he underwent a HST, read by Dr Buck and results were positive for sleep apnea , yet he declined treatment of apnea by CPAP-   For any job  that includes use of machinery, a sleep disorder patient is usually required to be treated first - CPAP for apnea, use of medications for narcolepsy (if this is something he is interested in pursing) but patient declined treatment per prior telephone note in 2024.  While I am glad to see him accepting treatment I am worried about his committment to therapy.   I will write for an Autotitration ResMed CPAP device, 5-20 cm water , 2 cm EPR, heated humidification and interface to be fitted.

## 2024-05-28 ENCOUNTER — Other Ambulatory Visit (HOSPITAL_COMMUNITY): Payer: Self-pay

## 2024-05-28 ENCOUNTER — Telehealth: Payer: Self-pay

## 2024-05-28 NOTE — Telephone Encounter (Signed)
 Pharmacy Patient Advocate Encounter   Received notification from CoverMyMeds that prior authorization for Armodafinil  is required/requested.   Insurance verification completed.   The patient is insured through [facility] .   Per test claim: PA required; PA started via CoverMyMeds. KEY [id number] . Waiting for clinical questions to populate.

## 2024-05-29 NOTE — Telephone Encounter (Signed)
 Pharmacy Patient Advocate Encounter  Received notification from [employer] that Prior Authorization for Armodafinil  has been APPROVED from 05/29/2024 to 05/29/2025   PA #/Case ID/Reference #: [id number]

## 2024-05-29 NOTE — Telephone Encounter (Signed)
 Clinical questions have been answered and PA submitted. PA currently Pending. Please be advised that most companies allow up to 30 days to make a decision. We will advise when a determination has been made, or follow up in 1 week.   Please reach out to our team, Rx Prior Auth Pool, if you haven't heard back in a week.

## 2024-06-15 ENCOUNTER — Encounter: Payer: Self-pay | Admitting: Emergency Medicine

## 2024-06-15 ENCOUNTER — Ambulatory Visit
Admission: EM | Admit: 2024-06-15 | Discharge: 2024-06-15 | Disposition: A | Discharge status: Home / Self Care | Payer: Self-pay | Attending: Internal Medicine | Admitting: Internal Medicine

## 2024-06-15 DIAGNOSIS — L03011 Cellulitis of right finger: Secondary | ICD-10-CM

## 2024-06-15 MED ORDER — SULFAMETHOXAZOLE-TRIMETHOPRIM 800-160 MG PO TABS
1.0000 | ORAL_TABLET | Freq: Two times a day (BID) | ORAL | 0 refills | Status: AC
Start: 2024-06-15 — End: 2024-06-22

## 2024-06-15 NOTE — ED Triage Notes (Signed)
 Pt c/o right middle pain, swelling, redness for almost 1 week. States he did push out some pus a few days ago.

## 2024-06-15 NOTE — Discharge Instructions (Addendum)
 Take Bactrim and a biotic every 12 hours (each morning and each evening) with food for the next 7 days.  Please use Epsom salt soaks every 4-6 hours to reduce swelling and inflammation.  Follow-up with the hand specialist in the next 5 to 7 days if your finger becomes more swollen or painful.  Your pain should start to improve in the next 2 to 3 days while taking the antibiotic.  Please go to the emergency room if you lose sensation to the tip of your finger or if you notice any streaking redness to the top of the finger or into the right hand.  If you develop any new or worsening symptoms or if your symptoms do not start to improve, please return here or follow-up with your primary care provider. If your symptoms are severe, please go to the emergency room.

## 2024-06-15 NOTE — ED Provider Notes (Signed)
 [facility]    CSN: [id number] Arrival date & time: 06/15/24  1613      History   Chief Complaint Chief Complaint  Patient presents with   Hand Pain   Abscess    HPI Phillip Yates is a 28 y.o. male.   Phillip Yates is a 28 y.o. male presenting for chief complaint of pain and swelling to the distal portion of the right middle finger nail fold that started approximately 1 week ago.  Denies recent trauma/injuries to the nail.  Denies numbness and tingling to the tip of the affected digit and damage to the nail of the affected digit.  He was able to expel a little bit of pus from the area of redness/swelling 2 to 3 days ago which alleviated some of the pressure but the pressure/pain returned and worsened over the last 24 to 48 hours.  Denies recent antibiotic or steroid use in the last 90 days.  No history of immunosuppression.  Denies history of MRSA infection.  He is not treating symptoms at home with any over-the-counter medications.     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 Authorizing Provider  sulfamethoxazole-trimethoprim (BACTRIM DS) 800-160 MG tablet Take 1 tablet by mouth 2 (two) times daily for 7 days. 06/15/24 06/22/24 Yes StanhopeDorna HERO, FNP  Armodafinil  250 MG tablet Take 1 tablet (250 mg total) by mouth daily. 04/25/24   Dohmeier, Dedra, MD    Family History Family History  Problem Relation Age of Onset   Cancer Maternal Grandfather        lung cancer from  smoking   Cancer Paternal Grandmother    Diabetes Paternal Uncle    Alcohol abuse Neg Hx    Arthritis Neg Hx    Asthma Neg Hx    Birth defects Neg Hx    Depression Neg Hx    COPD Neg Hx    Drug abuse Neg Hx    Early death Neg Hx    Hearing loss Neg Hx    Heart disease Neg Hx    Hyperlipidemia Neg Hx    Hypertension Neg Hx    Kidney disease Neg Hx    Learning disabilities Neg Hx    Mental illness Neg Hx    Mental retardation Neg Hx    Miscarriages / Stillbirths Neg Hx    Stroke Neg Hx    Vision loss Neg Hx    Varicose Veins Neg Hx     Social History Social History   Tobacco Use   Smoking status: Never   Smokeless tobacco: Never  Vaping Use   Vaping status: Never Used  Substance Use Topics   Alcohol use: No   Drug use: No     Allergies   Patient has no known allergies.   Review of Systems Review of Systems Per HPI  Physical Exam Triage Vital Signs ED Triage Vitals  Encounter Vitals Group     BP 06/15/24 1646 111/76     Girls Systolic BP Percentile --      Girls Diastolic BP Percentile --      Boys Systolic BP Percentile --      Boys Diastolic BP Percentile --      Pulse Rate 06/15/24 1646 75     Resp 06/15/24 1646 16     Temp 06/15/24 1646 97.6 F (36.4 C)     Temp Source 06/15/24 1646 Oral     SpO2 06/15/24 1646 95 %     Weight --      Height --      Head Circumference --      Peak Flow --      Pain Score 06/15/24 1648 5     Pain Loc --      Pain Education --      Exclude from Growth Chart --    No data found.  Updated Vital Signs BP 111/76 (BP Location: Right Arm)   Pulse 75   Temp 97.6 F (36.4 C) (Oral)   Resp 16   SpO2 95%   Visual Acuity Right Eye Distance:   Left Eye Distance:   Bilateral Distance:    Right Eye Near:   Left Eye Near:    Bilateral Near:     Physical Exam Vitals and nursing note reviewed.  Constitutional:      Appearance: He is not ill-appearing or toxic-appearing.  HENT:     Head: Normocephalic and  atraumatic.     Right Ear: Hearing and external ear normal.     Left Ear: Hearing and external ear normal.     Nose: Nose normal.     Mouth/Throat:     Lips: Pink.  Eyes:     General: Lids are normal. Vision grossly intact. Gaze aligned appropriately.     Extraocular Movements: Extraocular movements intact.     Conjunctiva/sclera: Conjunctivae normal.  Pulmonary:     Effort: Pulmonary effort is normal.  Musculoskeletal:     Right hand: Normal.     Left hand: Normal.     Cervical back: Neck supple.     Comments: Swelling, erythema, and evidence of purulence underneath the skin of the right middle finger medial nail fold.  There is erythema wrapping around the proximal nail fold and in the lateral nail fold anteriorly without signs of lymphangitis.  Erythema also present to the pad of the affected digit.  The pad of the right middle finger is fluctuant and soft without induration/palpable abscess.  +2 right radial pulse.  Less than 2 cap refill to affected digit.  Sensation intact distally.  Skin:    General: Skin is warm and dry.     Capillary Refill: Capillary refill takes less than 2 seconds.     Findings: No rash.  Neurological:     General: No focal deficit present.     Mental Status: He is alert and oriented to person, place, and time. Mental status is at baseline.     Cranial Nerves: No dysarthria or facial asymmetry.  Psychiatric:        Mood and Affect: Mood normal.        Speech: Speech normal.        Behavior: Behavior normal.        Thought Content: Thought content normal.        Judgment: Judgment normal.       Right middle finger medial nail  fold paronychia      UC Treatments / Results  Labs (all labs ordered are listed, but only abnormal results are displayed) Labs Reviewed - No data to display  EKG   Radiology No results found.  Procedures Incision and Drainage  Date/Time: 06/15/2024 6:02 PM  Performed by: Enedelia Dorna HERO, FNP Authorized  by: Enedelia Dorna HERO, FNP   Consent:    Consent obtained:  Verbal   Consent given by:  Patient   Risks, benefits, and alternatives were discussed: yes   Location:    Indications for incision and drainage: Paronychia.   Location:  Upper extremity   Upper extremity location:  Finger   Finger location:  R long finger (Medial nail fold of right middle finger) Pre-procedure details:    Skin preparation:  Chlorhexidine with alcohol Sedation:    Sedation type:  None Anesthesia:    Anesthesia method:  Topical application   Topical anesthesia: Pain-eeze spray. Procedure type:    Complexity:  Simple Procedure details:    Ultrasound guidance: no     Needle aspiration: no     Incision types:  Stab incision (Stab incision with 18-gauge needle)   Incision depth:  Dermal   Drainage:  Purulent and bloody   Drainage amount:  Moderate   Wound treatment:  Wound left open   Packing materials:  None Post-procedure details:    Procedure completion:  Tolerated well, no immediate complications  (including critical care time)  Medications Ordered in UC Medications - No data to display  Initial Impression / Assessment and Plan / UC Course  I have reviewed the triage vital signs and the nursing notes.  Pertinent labs & imaging results that were available during my care of the patient were reviewed by me and considered in my medical decision making (see chart for details).   1.  Cellulitis of right middle finger rule out paronychia right middle finger Presentation is consistent with paronychia of right middle finger with possible development of felon of finger. Will treat with Bactrim twice daily for 7 days. See incision and drainage note above for further details regarding I&D. Patient tolerated I&D well without difficulty.  Epsom salt soaks encouraged.  Patient given walking referral to hand specialist for follow-up as needed. Neurovascularly intact to distal affected digit currently, low  suspicion for acute bony abnormality/osteomyelitis and therefore imaging was not performed.  Counseled patient on potential for adverse effects with medications prescribed/recommended today, strict ER and return-to-clinic precautions discussed, patient verbalized understanding.    Final Clinical Impressions(s) / UC Diagnoses   Final diagnoses:  Cellulitis of right middle finger  Paronychia of right middle finger     Discharge Instructions      Take Bactrim and a biotic every 12 hours (each morning and each evening) with food for the next 7 days.  Please use Epsom salt soaks every 4-6 hours to reduce swelling and inflammation.  Follow-up with the hand specialist in the next 5 to 7 days if your finger becomes more swollen or painful.  Your pain should start to improve in the next 2 to 3 days while taking the antibiotic.  Please go to the emergency room if you lose sensation to the tip of your finger or if you notice any streaking redness to the top of the finger or into the right hand.  If you develop any new or worsening symptoms or if your symptoms do not start to improve, please return here or follow-up with your primary care  provider. If your symptoms are severe, please go to the emergency room.    ED Prescriptions     Medication Sig Dispense Auth. Provider   sulfamethoxazole-trimethoprim (BACTRIM DS) 800-160 MG tablet Take 1 tablet by mouth 2 (two) times daily for 7 days. 14 tablet Enedelia Dorna HERO, FNP      PDMP not reviewed this encounter.   Enedelia Dorna HERO, [place] 06/15/24 [phone]

## 2024-06-28 ENCOUNTER — Encounter: Payer: Self-pay | Admitting: Emergency Medicine

## 2024-07-02 ENCOUNTER — Encounter: Payer: Self-pay | Admitting: Emergency Medicine

## 2024-07-02 ENCOUNTER — Ambulatory Visit (INDEPENDENT_AMBULATORY_CARE_PROVIDER_SITE_OTHER): Payer: Self-pay | Admitting: Emergency Medicine

## 2024-07-02 VITALS — BP 108/60 | HR 74 | Resp 18 | Ht 68.0 in | Wt 257.0 lb

## 2024-07-02 DIAGNOSIS — Z0000 Encounter for general adult medical examination without abnormal findings: Secondary | ICD-10-CM

## 2024-07-02 DIAGNOSIS — Z130 Encounter for screening for diseases of the blood and blood-forming organs and certain disorders involving the immune mechanism: Secondary | ICD-10-CM | POA: Diagnosis not present

## 2024-07-02 DIAGNOSIS — Z13220 Encounter for screening for lipoid disorders: Secondary | ICD-10-CM

## 2024-07-02 DIAGNOSIS — F66 Other sexual disorders: Secondary | ICD-10-CM

## 2024-07-02 DIAGNOSIS — Z1329 Encounter for screening for other suspected endocrine disorder: Secondary | ICD-10-CM

## 2024-07-02 DIAGNOSIS — Z13228 Encounter for screening for other metabolic disorders: Secondary | ICD-10-CM

## 2024-07-02 NOTE — Progress Notes (Signed)
 Phillip Yates 28 y.o.   Chief Complaint  Patient presents with   Annual Exam    Doing well , no complaints    HISTORY OF PRESENT ILLNESS: This is a 28 y.o. male here for annual exam. Overall doing well.  Has no complaints or medical concerns today. Concerned about porn addiction.  Needs referral.  HPI   Prior to Admission medications   Medication Sig Start Date End Date Taking? Authorizing Provider  Armodafinil  250 MG tablet Take 1 tablet (250 mg total) by mouth daily. 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   Seasonal allergies     Past Surgical History:  Procedure Laterality Date   CIRCUMCISION      Social History   Socioeconomic History   Marital status: Single    Spouse name: Not on file   Number of children: 0   Years of education: 10   Highest education level: Not on file  Occupational History   Occupation: student  Tobacco Use   Smoking status: Never   Smokeless tobacco: Never  Vaping Use   Vaping status: Never Used  Substance and Sexual Activity   Alcohol use: No   Drug use: No   Sexual activity: Not on file  Other Topics Concern   Not on file  Social History Narrative   Lives with parents and younger brother   10th grade [employer]   Patient is left-handed.   Patient drinks tea occasionally.         Social Drivers of Corporate Investment Banker Strain: Not on file  Food Insecurity: Not on file  Transportation Needs: Not on file  Physical Activity: Not on file  Stress: Not on file  Social Connections: Not on file  Intimate Partner Violence: Not on file    Family History  Problem Relation Age of Onset    Cancer Maternal Grandfather        lung cancer from smoking   Cancer Paternal Grandmother    Diabetes Paternal Uncle    Alcohol abuse Neg Hx    Arthritis Neg Hx    Asthma Neg Hx    Birth defects Neg Hx    Depression Neg Hx    COPD Neg Hx    Drug abuse Neg Hx    Early death Neg Hx    Hearing loss Neg Hx    Heart disease Neg Hx    Hyperlipidemia Neg Hx    Hypertension Neg Hx    Kidney disease Neg Hx    Learning disabilities Neg Hx    Mental illness Neg Hx    Mental retardation Neg Hx    Miscarriages / Stillbirths Neg Hx    Stroke Neg Hx    Vision loss Neg Hx    Varicose Veins Neg Hx      Review of Systems  Constitutional: Negative.  Negative for chills and fever.  HENT: Negative.  Negative for congestion and sore throat.   Respiratory: Negative.  Negative for cough and shortness of breath.   Cardiovascular: Negative.  Negative for chest pain and palpitations.  Gastrointestinal:  Negative for abdominal pain, diarrhea, nausea and vomiting.  Genitourinary: Negative.  Negative for dysuria and hematuria.  Skin: Negative.  Negative for rash.  Neurological: Negative.  Negative for dizziness and headaches.  All other systems reviewed and are negative.   Today's Vitals   07/02/24 1414  BP: 108/60  Pulse: 74  Resp: 18  SpO2: 96%  Weight: 257 lb (116.6 kg)  Height: 5' 8 (1.727 m)   Body mass index is 39.08 kg/m.   Physical Exam Vitals reviewed.  Constitutional:      Appearance: Normal appearance.  HENT:     Head: Normocephalic.     Right Ear: Tympanic membrane, ear canal and external ear normal.     Left Ear: Tympanic membrane, ear canal and external ear normal.     Mouth/Throat:     Mouth: Mucous membranes are moist.     Pharynx: Oropharynx is clear.  Eyes:     Extraocular Movements: Extraocular movements intact.     Pupils: Pupils are equal, round, and reactive to light.  Cardiovascular:     Rate and Rhythm: Normal rate and regular rhythm.     Pulses: Normal  pulses.     Heart sounds: Normal heart sounds.  Pulmonary:     Effort: Pulmonary effort is normal.     Breath sounds: Normal breath sounds.  Abdominal:     Palpations: Abdomen is soft.     Tenderness: There is no abdominal tenderness.  Musculoskeletal:     Cervical back: No tenderness.  Lymphadenopathy:     Cervical: No cervical adenopathy.  Skin:    General: Skin is warm and dry.     Capillary Refill: Capillary refill takes less than 2 seconds.  Neurological:     General: No focal deficit present.     Mental Status: He is alert and oriented to person, place, and time.  Psychiatric:        Mood and Affect: Mood normal.        Behavior: Behavior normal.      ASSESSMENT & PLAN: Problem List Items Addressed This Visit   None Visit Diagnoses       Routine general medical examination at a health care facility    -  Primary   Relevant Orders   CBC with Differential/Platelet   Comprehensive metabolic panel with GFR   Hemoglobin A1c   Lipid panel     Pornography addiction       Relevant Orders   Ambulatory referral to Psychology     Screening for deficiency anemia       Relevant Orders   CBC with Differential/Platelet     Screening for lipoid disorders       Relevant Orders   Lipid panel     Screening for endocrine, metabolic and immunity disorder       Relevant Orders   Comprehensive metabolic panel with GFR   Hemoglobin A1c      Modifiable risk factors discussed with patient. Anticipatory guidance according to age provided. The following topics were also discussed: Social Determinants of Health Smoking.  Non-smoker Diet and nutrition Benefits of exercise Cancer family history review Vaccinations review and recommendations Cardiovascular risk assessment and need for blood work today Mental health including depression and anxiety Fall and accident prevention  Patient Instructions  Health Maintenance, Male Adopting a healthy lifestyle and getting preventive  care are important in promoting health and wellness. Ask your health care provider about: The right schedule for you to have regular  tests and exams. Things you can do on your own to prevent diseases and keep yourself healthy. What should I know about diet, weight, and exercise? Eat a healthy diet  Eat a diet that includes plenty of vegetables, fruits, low-fat dairy products, and lean protein. Do not eat a lot of foods that are high in solid fats, added sugars, or sodium. Maintain a healthy weight Body mass index (BMI) is a measurement that can be used to identify possible weight problems. It estimates body fat based on height and weight. Your health care provider can help determine your BMI and help you achieve or maintain a healthy weight. Get regular exercise Get regular exercise. This is one of the most important things you can do for your health. Most adults should: Exercise for at least 150 minutes each week. The exercise should increase your heart rate and make you sweat (moderate-intensity exercise). Do strengthening exercises at least twice a week. This is in addition to the moderate-intensity exercise. Spend less time sitting. Even light physical activity can be beneficial. Watch cholesterol and blood lipids Have your blood tested for lipids and cholesterol at 26 years of age, then have this test every 5 years. You may need to have your cholesterol levels checked more often if: Your lipid or cholesterol levels are high. You are older than 26 years of age. You are at high risk for heart disease. What should I know about cancer screening? Many types of cancers can be detected early and may often be prevented. Depending on your health history and family history, you may need to have cancer screening at various ages. This may include screening for: Colorectal cancer. Prostate cancer. Skin cancer. Lung cancer. What should I know about heart disease, diabetes, and high blood  pressure? Blood pressure and heart disease High blood pressure causes heart disease and increases the risk of stroke. This is more likely to develop in people who have high blood pressure readings or are overweight. Talk with your health care provider about your target blood pressure readings. Have your blood pressure checked: Every 3-5 years if you are 56-59 years of age. Every year if you are 53 years old or older. If you are between the ages of 50 and 36 and are a current or former smoker, ask your health care provider if you should have a one-time screening for abdominal aortic aneurysm (AAA). Diabetes Have regular diabetes screenings. This checks your fasting blood sugar level. Have the screening done: Once every three years after age 35 if you are at a normal weight and have a low risk for diabetes. More often and at a younger age if you are overweight or have a high risk for diabetes. What should I know about preventing infection? Hepatitis B If you have a higher risk for hepatitis B, you should be screened for this virus. Talk with your health care provider to find out if you are at risk for hepatitis B infection. Hepatitis C Blood testing is recommended for: Everyone born from [birth year] through 1963. Anyone with known risk factors for hepatitis C. Sexually transmitted infections (STIs) You should be screened each year for STIs, including gonorrhea and chlamydia, if: You are sexually active and are younger than 26 years of age. You are older than 26 years of age and your health care provider tells you that you are at risk for this type of infection. Your sexual activity has changed since you were last screened, and you are at increased risk for  chlamydia or gonorrhea. Ask your health care provider if you are at risk. Ask your health care provider about whether you are at high risk for HIV. Your health care provider may recommend a prescription medicine to help prevent HIV infection. If you  choose to take medicine to prevent HIV, you should first get tested for HIV. You should then be tested every 3 months for as long as you are taking the medicine. Follow these instructions at home: Alcohol use Do not drink alcohol if your health care provider tells you not to drink. If you drink alcohol: Limit how much you have to 0-2 drinks a day. Know how much alcohol is in your drink. In the [place] one drink equals one 12 oz bottle of beer (355 mL), one 5 oz glass of wine (148 mL), or one 1 oz glass of hard liquor (44 mL). Lifestyle Do not use any products that contain nicotine or tobacco. These products include cigarettes, chewing tobacco, and vaping devices, such as e-cigarettes. If you need help quitting, ask your health care provider. Do not use street drugs. Do not share needles. Ask your health care provider for help if you need support or information about quitting drugs. General instructions Schedule regular health, dental, and eye exams. Stay current with your vaccines. Tell your health care provider if: You often feel depressed. You have ever been abused or do not feel safe at home. Summary Adopting a healthy lifestyle and getting preventive care are important in promoting health and wellness. Follow your health care provider's instructions about healthy diet, exercising, and getting tested or screened for diseases. Follow your health care provider's instructions on monitoring your cholesterol and blood pressure. This information is not intended to replace advice given to you by your health care provider. Make sure you discuss any questions you have with your health care provider. Document Revised: 01/12/2021 Document Reviewed: 01/12/2021 Elsevier Patient Education  2024 [employer]    Emil Schaumann, MD [facility]

## 2024-07-02 NOTE — Patient Instructions (Signed)
 Health Maintenance, Male  Adopting a healthy lifestyle and getting preventive care are important in promoting health and wellness. Ask your health care provider about:  The right schedule for you to have regular tests and exams.  Things you can do on your own to prevent diseases and keep yourself healthy.  What should I know about diet, weight, and exercise?  Eat a healthy diet    Eat a diet that includes plenty of vegetables, fruits, low-fat dairy products, and lean protein.  Do not eat a lot of foods that are high in solid fats, added sugars, or sodium.  Maintain a healthy weight  Body mass index (BMI) is a measurement that can be used to identify possible weight problems. It estimates body fat based on height and weight. Your health care provider can help determine your BMI and help you achieve or maintain a healthy weight.  Get regular exercise  Get regular exercise. This is one of the most important things you can do for your health. Most adults should:  Exercise for at least 150 minutes each week. The exercise should increase your heart rate and make you sweat (moderate-intensity exercise).  Do strengthening exercises at least twice a week. This is in addition to the moderate-intensity exercise.  Spend less time sitting. Even light physical activity can be beneficial.  Watch cholesterol and blood lipids  Have your blood tested for lipids and cholesterol at 26 years of age, then have this test every 5 years.  You may need to have your cholesterol levels checked more often if:  Your lipid or cholesterol levels are high.  You are older than 26 years of age.  You are at high risk for heart disease.  What should I know about cancer screening?  Many types of cancers can be detected early and may often be prevented. Depending on your health history and family history, you may need to have cancer screening at various ages. This may include screening for:  Colorectal cancer.  Prostate cancer.  Skin cancer.  Lung  cancer.  What should I know about heart disease, diabetes, and high blood pressure?  Blood pressure and heart disease  High blood pressure causes heart disease and increases the risk of stroke. This is more likely to develop in people who have high blood pressure readings or are overweight.  Talk with your health care provider about your target blood pressure readings.  Have your blood pressure checked:  Every 3-5 years if you are 24-52 years of age.  Every year if you are 3 years old or older.  If you are between the ages of 60 and 72 and are a current or former smoker, ask your health care provider if you should have a one-time screening for abdominal aortic aneurysm (AAA).  Diabetes  Have regular diabetes screenings. This checks your fasting blood sugar level. Have the screening done:  Once every three years after age 66 if you are at a normal weight and have a low risk for diabetes.  More often and at a younger age if you are overweight or have a high risk for diabetes.  What should I know about preventing infection?  Hepatitis B  If you have a higher risk for hepatitis B, you should be screened for this virus. Talk with your health care provider to find out if you are at risk for hepatitis B infection.  Hepatitis C  Blood testing is recommended for:  Everyone born from [birth year] through 1963.  Anyone  with known risk factors for hepatitis C.  Sexually transmitted infections (STIs)  You should be screened each year for STIs, including gonorrhea and chlamydia, if:  You are sexually active and are younger than 26 years of age.  You are older than 26 years of age and your health care provider tells you that you are at risk for this type of infection.  Your sexual activity has changed since you were last screened, and you are at increased risk for chlamydia or gonorrhea. Ask your health care provider if you are at risk.  Ask your health care provider about whether you are at high risk for HIV. Your health care provider  may recommend a prescription medicine to help prevent HIV infection. If you choose to take medicine to prevent HIV, you should first get tested for HIV. You should then be tested every 3 months for as long as you are taking the medicine.  Follow these instructions at home:  Alcohol use  Do not drink alcohol if your health care provider tells you not to drink.  If you drink alcohol:  Limit how much you have to 0-2 drinks a day.  Know how much alcohol is in your drink. In the [place] one drink equals one 12 oz bottle of beer (355 mL), one 5 oz glass of wine (148 mL), or one 1 oz glass of hard liquor (44 mL).  Lifestyle  Do not use any products that contain nicotine or tobacco. These products include cigarettes, chewing tobacco, and vaping devices, such as e-cigarettes. If you need help quitting, ask your health care provider.  Do not use street drugs.  Do not share needles.  Ask your health care provider for help if you need support or information about quitting drugs.  General instructions  Schedule regular health, dental, and eye exams.  Stay current with your vaccines.  Tell your health care provider if:  You often feel depressed.  You have ever been abused or do not feel safe at home.  Summary  Adopting a healthy lifestyle and getting preventive care are important in promoting health and wellness.  Follow your health care provider's instructions about healthy diet, exercising, and getting tested or screened for diseases.  Follow your health care provider's instructions on monitoring your cholesterol and blood pressure.  This information is not intended to replace advice given to you by your health care provider. Make sure you discuss any questions you have with your health care provider.  Document Revised: 01/12/2021 Document Reviewed: 01/12/2021  [employer]vier Patient Education  2024 [employer].

## 2024-07-03 ENCOUNTER — Other Ambulatory Visit: Payer: Self-pay | Admitting: Emergency Medicine

## 2024-07-03 DIAGNOSIS — F66 Other sexual disorders: Secondary | ICD-10-CM

## 2025-07-17 ENCOUNTER — Encounter: Admitting: Emergency Medicine
